# Patient Record
Sex: Male | Born: 1985 | Race: White | Hispanic: No | Smoking: Current every day smoker
Health system: Southern US, Community
[De-identification: ages and names within clinical notes are randomized; demographics above are authoritative.]

## PROBLEM LIST (undated history)

## (undated) VITALS — BP 117/69 | HR 81 | Temp 98.6°F

## (undated) VITALS — BP 123/82 | HR 97 | Temp 98.1°F | Resp 16

## (undated) DIAGNOSIS — F32A Depression, unspecified: Secondary | ICD-10-CM

## (undated) DIAGNOSIS — F4310 Post-traumatic stress disorder, unspecified: Secondary | ICD-10-CM

## (undated) DIAGNOSIS — F1510 Other stimulant abuse, uncomplicated: Secondary | ICD-10-CM

## (undated) DIAGNOSIS — B1920 Unspecified viral hepatitis C without hepatic coma: Secondary | ICD-10-CM

## (undated) DIAGNOSIS — F1290 Cannabis use, unspecified, uncomplicated: Secondary | ICD-10-CM

## (undated) DIAGNOSIS — F1990 Other psychoactive substance use, unspecified, uncomplicated: Secondary | ICD-10-CM

## (undated) DIAGNOSIS — F1110 Opioid abuse, uncomplicated: Secondary | ICD-10-CM

## (undated) DIAGNOSIS — F419 Anxiety disorder, unspecified: Secondary | ICD-10-CM

---

## 2009-01-13 ENCOUNTER — Emergency Department (HOSPITAL_BASED_OUTPATIENT_CLINIC_OR_DEPARTMENT_OTHER): Admission: EM | Admit: 2009-01-13 | Discharge: 2009-01-13 | Payer: Self-pay | Admitting: Emergency Medicine

## 2009-02-04 ENCOUNTER — Emergency Department (HOSPITAL_BASED_OUTPATIENT_CLINIC_OR_DEPARTMENT_OTHER): Admission: EM | Admit: 2009-02-04 | Discharge: 2009-02-04 | Payer: Self-pay | Admitting: Emergency Medicine

## 2009-02-04 ENCOUNTER — Ambulatory Visit: Payer: Self-pay | Admitting: Diagnostic Radiology

## 2009-09-23 ENCOUNTER — Emergency Department (HOSPITAL_COMMUNITY): Admission: EM | Admit: 2009-09-23 | Discharge: 2009-09-24 | Payer: Self-pay | Admitting: Emergency Medicine

## 2009-10-20 ENCOUNTER — Emergency Department (HOSPITAL_COMMUNITY): Admission: EM | Admit: 2009-10-20 | Discharge: 2009-10-21 | Payer: Self-pay | Admitting: Internal Medicine

## 2010-04-16 LAB — DIFFERENTIAL
Basophils Absolute: 0 10*3/uL (ref 0.0–0.1)
Basophils Relative: 0 % (ref 0–1)
Eosinophils Relative: 1 % (ref 0–5)
Monocytes Absolute: 0.6 10*3/uL (ref 0.1–1.0)
Monocytes Relative: 6 % (ref 3–12)
Neutro Abs: 6.2 10*3/uL (ref 1.7–7.7)

## 2010-04-16 LAB — BASIC METABOLIC PANEL
BUN: 5 mg/dL — ABNORMAL LOW (ref 6–23)
Chloride: 108 mEq/L (ref 96–112)
Creatinine, Ser: 0.94 mg/dL (ref 0.4–1.5)
GFR calc Af Amer: >60 mL/min (ref >60)
GFR calc non Af Amer: >60 mL/min (ref >60)

## 2010-04-16 LAB — CBC
Hemoglobin: 17 g/dL (ref 13.0–17.0)
MCH: 32.5 pg (ref 26.0–34.0)
MCHC: 34.7 g/dL (ref 30.0–36.0)
Platelets: 240 10*3/uL (ref 150–400)
RDW: 12.7 % (ref 11.5–15.5)

## 2010-04-16 LAB — RAPID URINE DRUG SCREEN, HOSP PERFORMED
Amphetamines: NOT DETECTED
Benzodiazepines: NOT DETECTED
Cocaine: NOT DETECTED
Opiates: NOT DETECTED

## 2010-04-17 LAB — CBC
HCT: 46.8 % (ref 39.0–52.0)
MCH: 32.7 pg (ref 26.0–34.0)
MCHC: 34.1 g/dL (ref 30.0–36.0)
MCV: 95.7 fL (ref 78.0–100.0)
Platelets: 224 10*3/uL (ref 150–400)
RDW: 13.1 % (ref 11.5–15.5)
WBC: 11.5 10*3/uL — ABNORMAL HIGH (ref 4.0–10.5)

## 2010-04-17 LAB — DIFFERENTIAL
Basophils Relative: 0 % (ref 0–1)
Eosinophils Absolute: 0.4 10*3/uL (ref 0.0–0.7)
Monocytes Absolute: 0.8 10*3/uL (ref 0.1–1.0)
Monocytes Relative: 7 % (ref 3–12)
Neutro Abs: 6.3 10*3/uL (ref 1.7–7.7)
Neutrophils Relative %: 55 % (ref 43–77)

## 2010-04-17 LAB — BASIC METABOLIC PANEL
BUN: 10 mg/dL (ref 6–23)
CO2: 28 mEq/L (ref 19–32)
Calcium: 9.3 mg/dL (ref 8.4–10.5)
Chloride: 106 mEq/L (ref 96–112)
GFR calc non Af Amer: >60 mL/min (ref >60)
Glucose, Bld: 101 mg/dL — ABNORMAL HIGH (ref 70–99)

## 2010-04-17 LAB — RAPID URINE DRUG SCREEN, HOSP PERFORMED
Amphetamines: NOT DETECTED
Opiates: POSITIVE — AB
Tetrahydrocannabinol: NOT DETECTED
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
## 2011-07-28 DIAGNOSIS — F1490 Cocaine use, unspecified, uncomplicated: Secondary | ICD-10-CM | POA: Insufficient documentation

## 2011-07-28 DIAGNOSIS — F17200 Nicotine dependence, unspecified, uncomplicated: Secondary | ICD-10-CM | POA: Diagnosis present

## 2011-07-28 DIAGNOSIS — F1011 Alcohol abuse, in remission: Secondary | ICD-10-CM | POA: Insufficient documentation

## 2019-01-30 ENCOUNTER — Other Ambulatory Visit: Payer: Self-pay

## 2019-01-30 ENCOUNTER — Emergency Department (HOSPITAL_COMMUNITY)
Admission: EM | Admit: 2019-01-30 | Discharge: 2019-01-30 | Disposition: A | Discharge status: Home / Self Care | Payer: Medicaid Other | Attending: Emergency Medicine | Admitting: Emergency Medicine

## 2019-01-30 DIAGNOSIS — K0889 Other specified disorders of teeth and supporting structures: Secondary | ICD-10-CM | POA: Insufficient documentation

## 2019-01-30 MED ORDER — KETOROLAC TROMETHAMINE 30 MG/ML IJ SOLN
30.0000 mg | Freq: Once | INTRAMUSCULAR | Status: AC
  Administered 2019-01-30: 30 mg via INTRAMUSCULAR
  Filled 2019-01-30: qty 1

## 2019-01-30 MED ORDER — CLINDAMYCIN HCL 150 MG PO CAPS: 300.0000 mg | ORAL_CAPSULE | Freq: Three times a day (TID) | ORAL | 0 refills | Status: AC

## 2019-01-30 MED ORDER — IBUPROFEN 800 MG PO TABS: 800.0000 mg | ORAL_TABLET | Freq: Three times a day (TID) | ORAL | 0 refills | Status: AC

## 2019-01-30 MED ORDER — HYDROCODONE-ACETAMINOPHEN 5-325 MG PO TABS
1.0000 | ORAL_TABLET | Freq: Once | ORAL | Status: DC
  Filled 2019-01-30: qty 1

## 2019-01-30 NOTE — ED Provider Notes (Signed)
[facility] DEPT Provider Note   CSN: [id number] Arrival date & time: 01/30/19  1046     History Chief Complaint  Patient presents with  . Dental Pain    Nathan Escobar is a 33 y.o. male.  33 y.o male with no PMH presents to the ED with a chief complaint of dental pain x 1 year. Patient describes as an aching sensation to the right upper gumline, this is worse with talking along with mastication. He reports this has been ongoing for the past year however he has failed to seek treatment due to lack of insurance. He has tried Orajel, ibuprofen without improvement in symptoms. He denies any difficulty swallowing, facial swelling or fevers.   The history is provided by the patient.  Dental Pain Location:  Upper Associated symptoms: no fever        No past medical history on file.  There are no problems to display for this patient.      No family history on file.  Social History   Tobacco Use  . Smoking status: Not on file  Substance Use Topics  . Alcohol use: Not on file  . Drug use: Not on file    Home Medications Prior to Admission medications   Medication Sig Start Date End Date Taking? Authorizing Provider  clindamycin (CLEOCIN) 150 MG capsule Take 2 capsules (300 mg total) by mouth 3 (three) times daily for 7 days. 01/30/19 02/06/19  Janeece Fitting, PA-C  ibuprofen (ADVIL) 800 MG tablet Take 1 tablet (800 mg total) by mouth 3 (three) times daily for 7 days. 01/30/19 02/06/19  Janeece Fitting, PA-C    Allergies    Patient has no known allergies.  Review of Systems   Review of Systems  Constitutional: Negative for fever.  HENT: Positive for dental problem.     Physical Exam Updated Vital Signs BP 136/78   Pulse 79   Temp 98.1 F (36.7 C) (Oral)   Resp 16   SpO2 99%   Physical Exam Vitals and nursing note reviewed.  Constitutional:      Appearance: Normal appearance.  HENT:     Head: Normocephalic and atraumatic.     Nose:  Nose normal.     Mouth/Throat:     Mouth: Mucous membranes are moist.     Dentition: Abnormal dentition. Has dentures. Dental tenderness present. No gingival swelling, dental abscesses or gum lesions.     Pharynx: Oropharynx is clear.     Tonsils: No tonsillar exudate or tonsillar abscesses.     Comments: Poor dentition throughout. Half of third molar is missing, chipped. White pus noted around.  Eyes:     Pupils: Pupils are equal, round, and reactive to light.  Cardiovascular:     Rate and Rhythm: Normal rate.     Pulses: Normal pulses.  Pulmonary:     Effort: Pulmonary effort is normal.  Abdominal:     General: Abdomen is flat.  Musculoskeletal:     Cervical back: Normal range of motion and neck supple.  Skin:    General: Skin is warm and dry.  Neurological:     Mental Status: He is alert and oriented to person, place, and time.     ED Results / Procedures / Treatments   Labs (all labs ordered are listed, but only abnormal results are displayed) Labs Reviewed - No data to display  EKG None  Radiology No results found.  Procedures Procedures (including critical care time)  Medications Ordered  in ED Medications  ketorolac (TORADOL) 30 MG/ML injection 30 mg (30 mg Intramuscular Given 01/30/19 1147)    ED Course  I have reviewed the triage vital signs and the nursing notes.  Pertinent labs & imaging results that were available during my care of the patient were reviewed by me and considered in my medical decision making (see chart for details).    MDM Rules/Calculators/A&P  Patient with a pertinent past medical history presents to the ED with complaints of dental pain, this is been ongoing for the past year, he has failed to schedule an appointment with dentist due to financial reasons.  During my evaluation missing teeth throughout his whole mouth, there is poor dentition throughout, small amount of pus on the back of his gumline, he is currently smoking  approximately half a pack a day.  Tended to provide patient with pain medication while in the ED, unfortunate patient is currently on Suboxone and cannot take any narcotic pain medication.  Toradol IM 30 mg was given.  Patient will go home with a prescription for clindamycin, ibuprofen, dental resources in order to follow-up with dental care.  He understands and agrees to management, return precautions provided at length.   Portions of this note were generated with Scientist, clinical (histocompatibility and immunogenetics). Dictation errors may occur despite best attempts at proofreading.  Final Clinical Impression(s) / ED Diagnoses Final diagnoses:  Pain, dental    Rx / DC Orders ED Discharge Orders         Ordered    clindamycin (CLEOCIN) 150 MG capsule  3 times daily     01/30/19 1147    ibuprofen (ADVIL) 800 MG tablet  3 times daily     01/30/19 1147           [name] Manges, PA-C 01/30/19 1152    Terald Sleeper, MD 01/30/19 2115
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
## 2019-01-30 NOTE — Discharge Instructions (Signed)
I have prescribed antibiotics to help treat your dental infection, please take 2 tablets 3 times a day for the next 7 days.  I also prescribed pain medication, you may take this for a maximum dose of 3 daily.  The number to dental resources attached to your chart,please schedule an appointment for further follow up.

## 2019-02-21 DIAGNOSIS — Z765 Malingerer [conscious simulation]: Secondary | ICD-10-CM | POA: Insufficient documentation

## 2019-02-21 DIAGNOSIS — F1520 Other stimulant dependence, uncomplicated: Secondary | ICD-10-CM | POA: Insufficient documentation

## 2019-03-08 DIAGNOSIS — F602 Antisocial personality disorder: Secondary | ICD-10-CM | POA: Diagnosis present

## 2019-03-08 DIAGNOSIS — F1120 Opioid dependence, uncomplicated: Secondary | ICD-10-CM | POA: Insufficient documentation

## 2019-04-03 ENCOUNTER — Encounter: Payer: Medicaid Other | Admitting: Family

## 2019-04-23 ENCOUNTER — Encounter: Payer: Medicaid Other | Admitting: Family

## 2019-05-21 ENCOUNTER — Encounter: Payer: Medicaid Other | Admitting: Family

## 2020-04-18 DIAGNOSIS — T43625A Adverse effect of amphetamines, initial encounter: Secondary | ICD-10-CM | POA: Insufficient documentation

## 2021-05-20 DIAGNOSIS — F4310 Post-traumatic stress disorder, unspecified: Secondary | ICD-10-CM | POA: Insufficient documentation

## 2021-06-23 ENCOUNTER — Other Ambulatory Visit (HOSPITAL_COMMUNITY)
Admission: EM | Admit: 2021-06-23 | Discharge: 2021-06-25 | Disposition: A | Discharge status: Home / Self Care | Payer: Medicaid Other | Attending: Psychiatry | Admitting: Psychiatry

## 2021-06-23 ENCOUNTER — Ambulatory Visit (HOSPITAL_COMMUNITY)
Admission: AD | Admit: 2021-06-23 | Discharge: 2021-06-23 | Disposition: A | Discharge status: Home / Self Care | Payer: Medicaid Other | Source: Home / Self Care | Attending: Psychiatry | Admitting: Psychiatry

## 2021-06-23 ENCOUNTER — Encounter (HOSPITAL_COMMUNITY): Payer: Self-pay | Admitting: Nurse Practitioner

## 2021-06-23 DIAGNOSIS — F1290 Cannabis use, unspecified, uncomplicated: Secondary | ICD-10-CM | POA: Insufficient documentation

## 2021-06-23 DIAGNOSIS — F1110 Opioid abuse, uncomplicated: Secondary | ICD-10-CM | POA: Diagnosis not present

## 2021-06-23 DIAGNOSIS — R443 Hallucinations, unspecified: Secondary | ICD-10-CM | POA: Insufficient documentation

## 2021-06-23 DIAGNOSIS — F1990 Other psychoactive substance use, unspecified, uncomplicated: Secondary | ICD-10-CM

## 2021-06-23 DIAGNOSIS — Z20822 Contact with and (suspected) exposure to covid-19: Secondary | ICD-10-CM | POA: Insufficient documentation

## 2021-06-23 DIAGNOSIS — F1520 Other stimulant dependence, uncomplicated: Secondary | ICD-10-CM | POA: Diagnosis not present

## 2021-06-23 DIAGNOSIS — F32A Depression, unspecified: Secondary | ICD-10-CM | POA: Insufficient documentation

## 2021-06-23 DIAGNOSIS — F1910 Other psychoactive substance abuse, uncomplicated: Secondary | ICD-10-CM | POA: Insufficient documentation

## 2021-06-23 DIAGNOSIS — F1994 Other psychoactive substance use, unspecified with psychoactive substance-induced mood disorder: Secondary | ICD-10-CM | POA: Diagnosis not present

## 2021-06-23 DIAGNOSIS — R45851 Suicidal ideations: Secondary | ICD-10-CM | POA: Insufficient documentation

## 2021-06-23 HISTORY — DX: Post-traumatic stress disorder, unspecified: F43.10

## 2021-06-23 HISTORY — DX: Opioid abuse, uncomplicated: F11.10

## 2021-06-23 HISTORY — DX: Depression, unspecified: F32.A

## 2021-06-23 HISTORY — DX: Other psychoactive substance use, unspecified, uncomplicated: F19.90

## 2021-06-23 HISTORY — DX: Anxiety disorder, unspecified: F41.9

## 2021-06-23 HISTORY — DX: Unspecified viral hepatitis C without hepatic coma: B19.20

## 2021-06-23 HISTORY — DX: Cannabis use, unspecified, uncomplicated: F12.90

## 2021-06-23 HISTORY — DX: Other stimulant abuse, uncomplicated: F15.10

## 2021-06-23 LAB — RESP PANEL BY RT-PCR (FLU A&B, COVID) ARPGX2
Influenza A by PCR: NEGATIVE
Influenza B by PCR: NEGATIVE
SARS Coronavirus 2 by RT PCR: NEGATIVE

## 2021-06-23 MED ORDER — ONDANSETRON 4 MG PO TBDP: 4.0000 mg | ORAL_TABLET | Freq: Four times a day (QID) | ORAL | Status: DC | PRN

## 2021-06-23 MED ORDER — HYDROXYZINE HCL 25 MG PO TABS: 25.0000 mg | ORAL_TABLET | Freq: Four times a day (QID) | ORAL | Status: DC | PRN

## 2021-06-23 MED ORDER — DICYCLOMINE HCL 20 MG PO TABS: 20.0000 mg | ORAL_TABLET | Freq: Four times a day (QID) | ORAL | Status: DC | PRN

## 2021-06-23 MED ORDER — ALUM & MAG HYDROXIDE-SIMETH 200-200-20 MG/5ML PO SUSP: 30.0000 mL | ORAL | Status: DC | PRN

## 2021-06-23 MED ORDER — LOPERAMIDE HCL 2 MG PO CAPS: 2.0000 mg | ORAL_CAPSULE | ORAL | Status: DC | PRN

## 2021-06-23 MED ORDER — MAGNESIUM HYDROXIDE 400 MG/5ML PO SUSP: 30.0000 mL | Freq: Every day | ORAL | Status: DC | PRN

## 2021-06-23 MED ORDER — NAPROXEN 500 MG PO TABS: 500.0000 mg | ORAL_TABLET | Freq: Two times a day (BID) | ORAL | Status: DC | PRN

## 2021-06-23 MED ORDER — GABAPENTIN 400 MG PO CAPS
400.0000 mg | ORAL_CAPSULE | Freq: Three times a day (TID) | ORAL | Status: DC
  Administered 2021-06-23 – 2021-06-25 (×5): 400 mg via ORAL
  Filled 2021-06-23 (×5): qty 1

## 2021-06-23 MED ORDER — ACETAMINOPHEN 325 MG PO TABS
650.0000 mg | ORAL_TABLET | Freq: Four times a day (QID) | ORAL | Status: DC | PRN
Start: 2021-06-23 — End: 2021-06-25

## 2021-06-23 MED ORDER — METHOCARBAMOL 500 MG PO TABS: 500.0000 mg | ORAL_TABLET | Freq: Three times a day (TID) | ORAL | Status: DC | PRN

## 2021-06-23 MED ORDER — QUETIAPINE FUMARATE 50 MG PO TABS
50.0000 mg | ORAL_TABLET | Freq: Every day | ORAL | Status: DC
  Administered 2021-06-23 – 2021-06-24 (×2): 50 mg via ORAL
  Filled 2021-06-23 (×2): qty 1
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
## 2021-06-23 NOTE — ED Notes (Signed)
Sharilyn Sites has been called to pick up pts specimen and take them over to  lab

## 2021-06-23 NOTE — ED Notes (Signed)
Pt is currently sleeping, no distress noted, environmental check complete, will continue to monitor patient for safety. ? ?
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
## 2021-06-23 NOTE — Progress Notes (Signed)
Patient's 15 min antigen was negative. PCR sent to the lab.

## 2021-06-23 NOTE — ED Notes (Signed)
While taking patients vitals pt c/o of upper abdominal lower left sided chest pain. Pt states that he think it is due to the fact that he has been doing Meth over the weekend. Pt rates the pain at a 2 out of 10. RN made NP Channing Mutters aware of patients complaints.

## 2021-06-23 NOTE — ED Notes (Signed)
Pt has been brought on the unit, familiarized with unit. Pt has eaten a sandwich and drank cranberry juice. Medication has been given. PT is now lying in the bed quietly, with eyes closed, will continue to monitor patient for safety

## 2021-06-24 ENCOUNTER — Encounter (HOSPITAL_COMMUNITY): Payer: Self-pay | Admitting: Nurse Practitioner

## 2021-06-24 ENCOUNTER — Other Ambulatory Visit: Payer: Self-pay

## 2021-06-24 ENCOUNTER — Encounter (HOSPITAL_COMMUNITY): Payer: Self-pay

## 2021-06-24 DIAGNOSIS — F1520 Other stimulant dependence, uncomplicated: Secondary | ICD-10-CM | POA: Diagnosis not present

## 2021-06-24 DIAGNOSIS — Z20822 Contact with and (suspected) exposure to covid-19: Secondary | ICD-10-CM | POA: Diagnosis not present

## 2021-06-24 DIAGNOSIS — F1994 Other psychoactive substance use, unspecified with psychoactive substance-induced mood disorder: Secondary | ICD-10-CM | POA: Diagnosis not present

## 2021-06-24 DIAGNOSIS — F1110 Opioid abuse, uncomplicated: Secondary | ICD-10-CM | POA: Diagnosis not present

## 2021-06-24 LAB — POCT URINE DRUG SCREEN - MANUAL ENTRY (I-SCREEN)
POC Amphetamine UR: NOT DETECTED
POC Buprenorphine (BUP): NOT DETECTED
POC Cocaine UR: NOT DETECTED
POC Marijuana UR: POSITIVE — AB
POC Methadone UR: NOT DETECTED
POC Methamphetamine UR: POSITIVE — AB
POC Morphine: NOT DETECTED
POC Oxazepam (BZO): NOT DETECTED
POC Oxycodone UR: NOT DETECTED
POC Secobarbital (BAR): NOT DETECTED

## 2021-06-24 LAB — COMPREHENSIVE METABOLIC PANEL
ALT: 52 U/L — ABNORMAL HIGH (ref 0–44)
AST: 28 U/L (ref 15–41)
Albumin: 3.3 g/dL — ABNORMAL LOW (ref 3.5–5.0)
Alkaline Phosphatase: 57 U/L (ref 38–126)
Anion gap: 8 (ref 5–15)
BUN: 14 mg/dL (ref 6–20)
CO2: 27 mmol/L (ref 22–32)
Calcium: 9 mg/dL (ref 8.9–10.3)
Chloride: 104 mmol/L (ref 98–111)
Creatinine, Ser: 0.85 mg/dL (ref 0.61–1.24)
GFR, Estimated: >60 mL/min (ref >60)
Glucose, Bld: 119 mg/dL — ABNORMAL HIGH (ref 70–99)
Potassium: 3.6 mmol/L (ref 3.5–5.1)
Sodium: 139 mmol/L (ref 135–145)
Total Bilirubin: 0.2 mg/dL — ABNORMAL LOW (ref 0.3–1.2)
Total Protein: 6.1 g/dL — ABNORMAL LOW (ref 6.5–8.1)

## 2021-06-24 LAB — CBC WITH DIFFERENTIAL/PLATELET
Abs Immature Granulocytes: 0.1 10*3/uL — ABNORMAL HIGH (ref 0.00–0.07)
Basophils Absolute: 0.1 10*3/uL (ref 0.0–0.1)
Basophils Relative: 0 %
Eosinophils Absolute: 0.3 10*3/uL (ref 0.0–0.5)
Eosinophils Relative: 2 %
HCT: 42.1 % (ref 39.0–52.0)
Hemoglobin: 14.1 g/dL (ref 13.0–17.0)
Immature Granulocytes: 1 %
Lymphocytes Relative: 27 %
Lymphs Abs: 3.3 10*3/uL (ref 0.7–4.0)
MCH: 31.3 pg (ref 26.0–34.0)
MCHC: 33.5 g/dL (ref 30.0–36.0)
MCV: 93.6 fL (ref 80.0–100.0)
Monocytes Absolute: 0.8 10*3/uL (ref 0.1–1.0)
Monocytes Relative: 6 %
Neutro Abs: 7.6 10*3/uL (ref 1.7–7.7)
Neutrophils Relative %: 64 %
Platelets: 267 10*3/uL (ref 150–400)
RBC: 4.5 MIL/uL (ref 4.22–5.81)
RDW: 13.6 % (ref 11.5–15.5)
WBC: 12.1 10*3/uL — ABNORMAL HIGH (ref 4.0–10.5)
nRBC: 0 % (ref 0.0–0.2)

## 2021-06-24 LAB — RPR: RPR Ser Ql: NONREACTIVE

## 2021-06-24 LAB — LIPID PANEL
Cholesterol: 102 mg/dL (ref 0–200)
HDL: 53 mg/dL (ref >40)
LDL Cholesterol: 17 mg/dL (ref 0–99)
Total CHOL/HDL Ratio: 1.9 RATIO
Triglycerides: 159 mg/dL — ABNORMAL HIGH (ref <150)
VLDL: 32 mg/dL (ref 0–40)

## 2021-06-24 LAB — HEPATITIS PANEL, ACUTE
HCV Ab: REACTIVE — AB
Hep A IgM: NONREACTIVE
Hep B C IgM: NONREACTIVE
Hepatitis B Surface Ag: NONREACTIVE

## 2021-06-24 LAB — GC/CHLAMYDIA PROBE AMP (~~LOC~~) NOT AT ARMC
Chlamydia: NEGATIVE
Comment: NEGATIVE
Comment: NORMAL
Neisseria Gonorrhea: NEGATIVE

## 2021-06-24 LAB — HEMOGLOBIN A1C
Hgb A1c MFr Bld: 5.4 % (ref 4.8–5.6)
Mean Plasma Glucose: 108.28 mg/dL

## 2021-06-24 LAB — TSH: TSH: 1.013 u[IU]/mL (ref 0.350–4.500)

## 2021-06-24 LAB — ETHANOL: Alcohol, Ethyl (B): <10 mg/dL (ref <10)

## 2021-06-24 LAB — HIV ANTIBODY (ROUTINE TESTING W REFLEX): HIV Screen 4th Generation wRfx: NONREACTIVE

## 2021-06-24 MED ORDER — FLUOXETINE HCL 20 MG PO CAPS
20.0000 mg | ORAL_CAPSULE | Freq: Every day | ORAL | Status: DC
  Administered 2021-06-24 – 2021-06-25 (×2): 20 mg via ORAL
  Filled 2021-06-24 (×2): qty 1

## 2021-06-24 NOTE — ED Notes (Addendum)
Pt woke up, breakfast given

## 2021-06-24 NOTE — ED Notes (Signed)
Pt has taken night time medications and is in his room sleeping.

## 2021-06-24 NOTE — ED Notes (Signed)
Notified pt that breakfast is ready 

## 2021-06-24 NOTE — BH IP Treatment Plan (Signed)
Interdisciplinary Treatment and Diagnostic Plan Update  06/24/2021 Time of Session: 10:20AM Nathan Escobar MRN: [id number]  Diagnosis:  Final diagnoses:  Methamphetamine use disorder, severe, dependence (HCC)  Heroin abuse (HCC)  Substance induced mood disorder (HCC)  IVDU (intravenous drug user)  Marijuana use     Current Medications:  Current Facility-Administered Medications  Medication Dose Route Frequency Provider Last Rate Last Admin   acetaminophen (TYLENOL) tablet 650 mg  650 mg Oral Q6H PRN Jackelyn PolingBerry, Jason A, NP       alum & mag hydroxide-simeth (MAALOX/MYLANTA) 200-200-20 MG/5ML suspension 30 mL  30 mL Oral Q4H PRN Jackelyn PolingBerry, Jason A, NP       dicyclomine (BENTYL) tablet 20 mg  20 mg Oral Q6H PRN Jackelyn PolingBerry, Jason A, NP       FLUoxetine (PROZAC) capsule 20 mg  20 mg Oral Daily Mariel CraftMaurer, Sheila M, MD   20 mg at 06/24/21 1131   gabapentin (NEURONTIN) capsule 400 mg  400 mg Oral TID Jackelyn PolingBerry, Jason A, NP   400 mg at 06/24/21 1556   hydrOXYzine (ATARAX) tablet 25 mg  25 mg Oral Q6H PRN Jackelyn PolingBerry, Jason A, NP       loperamide (IMODIUM) capsule 2-4 mg  2-4 mg Oral PRN Jackelyn PolingBerry, Jason A, NP       magnesium hydroxide (MILK OF MAGNESIA) suspension 30 mL  30 mL Oral Daily PRN Jackelyn PolingBerry, Jason A, NP       methocarbamol (ROBAXIN) tablet 500 mg  500 mg Oral Q8H PRN Nira ConnBerry, Jason A, NP       naproxen (NAPROSYN) tablet 500 mg  500 mg Oral BID PRN Nira ConnBerry, Jason A, NP       ondansetron (ZOFRAN-ODT) disintegrating tablet 4 mg  4 mg Oral Q6H PRN Nira ConnBerry, Jason A, NP       QUEtiapine (SEROQUEL) tablet 50 mg  50 mg Oral QHS Nira ConnBerry, Jason A, NP   50 mg at 06/23/21 2312   Current Outpatient Medications  Medication Sig Dispense Refill   FLUoxetine (PROZAC) 40 MG capsule Take 40 mg by mouth daily.     gabapentin (NEURONTIN) 400 MG capsule Take 400 mg by mouth 3 (three) times daily.     QUEtiapine (SEROQUEL) 50 MG tablet Take 50 mg by mouth at bedtime as needed (For sleep).     SUBOXONE 8-2 MG FILM Place 1 Film under the tongue 3  (three) times daily.     PTA Medications: Prior to Admission medications   Medication Sig Start Date End Date Taking? Authorizing Provider  FLUoxetine (PROZAC) 40 MG capsule Take 40 mg by mouth daily. 04/29/21  Yes [provider]  gabapentin (NEURONTIN) 400 MG capsule Take 400 mg by mouth 3 (three) times daily. 05/06/21  Yes [provider]  QUEtiapine (SEROQUEL) 50 MG tablet Take 50 mg by mouth at bedtime as needed (For sleep). 05/21/21  Yes [provider]  SUBOXONE 8-2 MG FILM Place 1 Film under the tongue 3 (three) times daily. 05/19/21  Yes [provider]    Patient Stressors: Financial difficulties   Substance abuse    Patient Strengths: Motivation for treatment/growth   Treatment Modalities: Medication Management, Group therapy, Case management,  1 to 1 session with clinician, Psychoeducation, Recreational therapy.   Physician Treatment Plan for Primary and Secondary Diagnosis:  Final diagnoses:  Methamphetamine use disorder, severe, dependence (HCC)  Heroin abuse (HCC)  Substance induced mood disorder (HCC)  IVDU (intravenous drug user)  Marijuana use   Long Term Goal(s): Improvement in  symptoms so as ready for discharge  Short Term Goals: Ability to identify changes in lifestyle to reduce recurrence of condition will improve Ability to verbalize feelings will improve Ability to disclose and discuss suicidal ideas Ability to demonstrate self-control will improve Ability to identify and develop effective coping behaviors will improve Compliance with prescribed medications will improve Ability to identify triggers associated with substance abuse/mental health issues will improve  Medication Management: Evaluate patient's response, side effects, and tolerance of medication regimen.  Therapeutic Interventions: 1 to 1 sessions, Unit Group sessions and Medication administration.  Evaluation of Outcomes: Progressing  RN Treatment Plan for  Primary Diagnosis:  Final diagnoses:  Methamphetamine use disorder, severe, dependence (HCC)  Heroin abuse (HCC)  Substance induced mood disorder (HCC)  IVDU (intravenous drug user)  Marijuana use    Long Term Goal(s): Knowledge of disease and therapeutic regimen to maintain health will improve  Short Term Goals: Ability to identify and develop effective coping behaviors will improve  Medication Management: RN will administer medications as ordered by provider, will assess and evaluate patient's response and provide education to patient for prescribed medication. RN will report any adverse and/or side effects to prescribing provider.  Therapeutic Interventions: 1 on 1 counseling sessions, Psychoeducation, Medication administration, Evaluate responses to treatment, Monitor vital signs and CBGs as ordered, Perform/monitor CIWA, COWS, AIMS and Fall Risk screenings as ordered, Perform wound care treatments as ordered.  Evaluation of Outcomes: Progressing   LCSW Treatment Plan for Primary Diagnosis:  Final diagnoses:  Methamphetamine use disorder, severe, dependence (HCC)  Heroin abuse (HCC)  Substance induced mood disorder (HCC)  IVDU (intravenous drug user)  Marijuana use    Long Term Goal(s): Safe transition to appropriate next level of care at discharge, Engage patient in therapeutic group addressing interpersonal concerns.  Short Term Goals: Engage patient in aftercare planning with referrals and resources  Therapeutic Interventions: Assess for all discharge needs, 1 to 1 time with Social worker, Explore available resources and support systems, Assess for adequacy in community support network, Educate family and significant other(s) on suicide prevention, Complete Psychosocial Assessment, Interpersonal group therapy.  Evaluation of Outcomes: Progressing   Progress in Treatment: Attending groups: No. Participating in groups: No. Taking medication as prescribed:  Yes. Toleration medication: Yes. Family/Significant other contact made: No, will contact:  no one Patient understands diagnosis: Yes. Discussing patient identified problems/goals with staff: Yes. Medical problems stabilized or resolved: Yes. Providers are following up on recommendations for patient's Hep C diagnosis.  Denies suicidal/homicidal ideation: Yes. Issues/concerns per patient self-inventory: No. Other: None   New problem(s) identified: None   New Short Term/Long Term Goal(s): Nathan Escobar shared that he wants to gain sobriety by participating in a residential treatment program.  Patient Goals:  "I need to go to treatment. I want to get clean"  Discharge Plan or Barriers: Nathan Escobar is tentatively scheduled for a screening appointment to [facility] for a screening appointment on Friday, 06/26/21 at 9:00am at [facility] for admission.   Reason for Continuation of Hospitalization: Medication stabilization  Estimated Length of Stay: 2-3 days  Last 3 [place] Suicide Severity Risk Score: Flowsheet Row ED from [facility]/23/2021 in [facility] Most recent reading at 06/24/2021 12:44 AM [facility] Most recent reading at 06/23/2021  9:08 PM  C-SSRS RISK CATEGORY High Risk High Risk       Last PHQ 2/9 Scores:     View : No  data to display.          Scribe for Treatment Team: Maeola Sarah, [place] 06/24/2021 4:24 PM

## 2021-06-24 NOTE — ED Notes (Signed)
Pt is currently sleeping, no distress noted, environmental check complete, will continue to monitor patient for safety. ? ?

## 2021-06-24 NOTE — ED Notes (Signed)
Pt resting quietly with eyes closed.  No pain or discomfort noted/voiced. Denies SI, HI, and AVH. Breathing is even and unlabored.  Will continue to monitor for safety.   

## 2021-06-24 NOTE — ED Notes (Signed)
Pt was notified that lunch is ready
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
## 2021-06-24 NOTE — ED Notes (Signed)
Pts declined AA he is in room sleeping.

## 2021-06-24 NOTE — ED Notes (Signed)
Pt resting quietly with eyes closed.  No pain or discomfort noted/voiced.  Breathing is even and unlabored.  Will continue to monitor for safety.  

## 2021-06-24 NOTE — ED Notes (Signed)
Pt refused to allow morning vitals to be taken

## 2021-06-24 NOTE — Clinical Social Work Psych Note (Signed)
LCSW Initial Note   LCSW met with Nathan Escobar for introduction and to begin discussions regarding treatment and potential discharge planning.   Nathan Escobar was initially sleeping, until he was awaken by this Probation officer and Dr. Leverne Humbles. Nathan Escobar presented with a dysphoric affect, congruent mood, however was pleasant and cooperative.   Nathan Escobar denied having any SI, HI or AVH at this time.   Nathan Escobar presented to the [facility] seeking assistance for worsening depression, suicidal thoughts without a plan, and request for substance abuse treatment. Nathan Escobar reports that the 1 year anniversary of his wife's death is approaching and he identifies this as a trigger for his depression and suicidal ideations. Nathan Escobar shared that he had a suicide attempt two weeks ago when he jumped from a vehicle into traffic.    Nathan Escobar endorsed injecting both heroin and methamphetamines as his drugs of choice. He reports using 1 gram of both substances on a daily basis. Nathan Escobar shared that he also experiences auditory hallucinations of voices call his name and visual hallucinations of spiders. He reports that his AVH only occurs during methamphetamine use.   Nathan Escobar shared that he currently lives with his grandmother and other family members, however he described the arrangement as "not good". Nathan Escobar reports there are five people living in his grandmother's two bedroom home.   Nathan Escobar expressed interest in participating in a residential treatment program following his stay at the [facility].   Dr. Leverne Humbles and Nathan Escobar agreed to restart his previous psychiatric medications for stabilization.   LCSW explained the referral process for residential treatment services and Arian expressed understanding.   LCSW referred Nathan Escobar to [facility] for review.   Bran denied having any additional questions or concerns at time. LCSW will continue to follow for any additional questions, concerns or additional needs.    Radonna Ricker, MSW, LCSW Clinical Research officer, political party ([place])  [facility]

## 2021-06-24 NOTE — ED Notes (Signed)
Patient admitted to [facility] endorsing SI, and substance abuse. Patient was cooperative during the admission assessment. Skin assessment complete. Patient oriented to unit and unit rules. Meal and drinks offered to patient. Patient resting quietly. Will continue to monitor for safety.

## 2021-06-25 DIAGNOSIS — F1110 Opioid abuse, uncomplicated: Secondary | ICD-10-CM | POA: Diagnosis not present

## 2021-06-25 DIAGNOSIS — F1994 Other psychoactive substance use, unspecified with psychoactive substance-induced mood disorder: Secondary | ICD-10-CM | POA: Diagnosis not present

## 2021-06-25 DIAGNOSIS — Z20822 Contact with and (suspected) exposure to covid-19: Secondary | ICD-10-CM | POA: Diagnosis not present

## 2021-06-25 DIAGNOSIS — F1520 Other stimulant dependence, uncomplicated: Secondary | ICD-10-CM | POA: Diagnosis not present

## 2021-06-25 MED ORDER — GABAPENTIN 400 MG PO CAPS: 400.0000 mg | ORAL_CAPSULE | Freq: Three times a day (TID) | ORAL | 0 refills | Status: DC

## 2021-06-25 MED ORDER — FLUOXETINE HCL 20 MG PO CAPS: 20.0000 mg | ORAL_CAPSULE | Freq: Every day | ORAL | 0 refills | Status: DC

## 2021-06-25 MED ORDER — QUETIAPINE FUMARATE 50 MG PO TABS: 50.0000 mg | ORAL_TABLET | Freq: Every day | ORAL | 0 refills | Status: DC

## 2021-06-25 NOTE — ED Notes (Addendum)
Pt asked for lunch at 09:40 AM, Tech told him lunch is at 12. Snacks and drinks given

## 2021-06-25 NOTE — Discharge Instructions (Addendum)

## 2021-06-25 NOTE — ED Notes (Signed)
Patient was awake for breakfast this morning.  Calm and quiet without complaint.  He has returned to room and sleep.  No withdrawal at this time.  Will monitor and provide safe supportive environment.

## 2021-06-25 NOTE — Clinical Social Work Psych Note (Signed)
Nathan Escobar Update/Discharge Note  Nathan Escobar shared that he felt "good" this morning. He denied having any SI, HI or AVH at this time. Nathan Escobar also denied having any physical complaints or withdrawal symptoms.   Nathan Escobar informed Nathan Escobar that his Hepatitis C follow-up could be a barrier for discharging to a residential treatment facility. Nathan Escobar shared that Nathan Escobar, the patient would have to be seen by a provider at [facility] for Infectious Disease prior to going to a rehab for treatment services to ensure he has the appropriate medications and follow up while in treatment.   Nathan Escobar is scheduled for his initial appointment with [facility] for Infectious Disease on Tuesday, 06/30/2021 at 1:45pm. This information was also charted in the patient's AVS.  Nathan Escobar expressed understanding and shared with Nathan Escobar that he would like to be discharged either way due to learning about a "family trip" coming up with his girlfriend and their children. Nathan Escobar reports that once he returns from their "Family trip" he plans on following up with Nathan Escobar for admission.    Nathan Escobar authorized a taxi voucher to [employer] with transportation home. Nathan Escobar reports he plans to return to his previous living arrangements at his grandmother's home.   Appropriate resources for outpatient psychiatric and substance abuse were listed in patient's AVS.   Nathan Escobar denied having any additional questions or concerns.   Radonna Ricker, MSW, Nathan Escobar Clinical Education officer, museum ([place]) [facility]

## 2021-06-25 NOTE — ED Notes (Signed)
Snacks given 

## 2021-06-25 NOTE — ED Notes (Signed)
Pt is currently sleeping, no distress noted, environmental check complete, will continue to monitor patient for safety. ? ?

## 2021-06-25 NOTE — ED Provider Notes (Signed)
FBC/OBS ASAP Discharge Summary  Date and Time: 06/25/2021 11:39 AM  Name: Nathan Escobar  MRN:  [id number]   Discharge Diagnoses:  Final diagnoses:  Methamphetamine use disorder, severe, dependence (HCC)  Heroin abuse (HCC)  Substance induced mood disorder (HCC)  IVDU (intravenous drug user)  Marijuana use  Polysubstance abuse (HCC)    Subjective: "I have a trip planned with my baby mama and my kids to go to patient/, [place].  It has been on my bucket list.  My grandfather always talked about it before he passed away."  Stay Summary: Nathan Escobar is a 36 y.o. with a reported history of schizophrenia, bipolar disorder, PTSD, methamphetamine abuse, heroin abuse, marijuana use, IVDU, and hepatitis C who presented to [facility] as a walk-in due to worsening depression, suicidal thoughts without a plan, and request for substance abuse treatment. Patient transferred to [facility] for crisis stabilization and substance abuse treatment on 06/23/2021 Patient is aware of resources for substance use rehabilitation after discharge.  Patient feels optimistic that he will be able to be successful with substance use abstinence. Patient completed acute detoxification.  He denies any withdrawal symptoms or cravings on day of discharge. Patient was restarted on his prior home medications fluoxetine 20 mg daily, gabapentin 400 mg 3 times daily, and Seroquel 50 mg at bedtime.  Patient denied any side effect to medication. Patient has continued to deny suicidal and homicidal ideations.  He is not having any auditory hallucinations.  He is able to contract for safety and aware of behavioral health urgent care resources if needed after discharge.  Hepatitis C antibodies were reactive.  Follow-up labs quantitative PCR and nucleic acid amplification tests are pending.  Patient is aware that he will need to follow-up with infectious disease regarding hepatitis C treatment.  Total Time Spent in Direct Patient Care:  I personally  spent 35 minutes on the unit in direct patient care. The direct patient care time included face-to-face time with the patient, reviewing the patient's chart, communicating with other professionals, and coordinating care. Greater than 50% of this time was spent in counseling or coordinating care with the patient regarding goals of hospitalization, psycho-education, and discharge planning needs.   Past Psychiatric History: schizophrenia, bipolar disorder, PTSD, methamphetamine abuse, heroin abuse, marijuana use, IVDU  Past Medical History:  Past Medical History:  Diagnosis Date   Anxiety    Depression    Hepatitis C    Heroin abuse (HCC)    IVDU (intravenous drug user)    Marijuana use    Methamphetamine abuse (HCC)    PTSD (post-traumatic stress disorder)    History reviewed. No pertinent surgical history. Family History:  Family History  Problem Relation Age of Onset   Depression Mother    Drug abuse Father    Bipolar disorder Father    Suicidality Paternal Aunt    Bipolar disorder Paternal Aunt    Family Psychiatric History: yes, see above  Social History:  Social History   Substance and Sexual Activity  Alcohol Use None     Social History   Substance and Sexual Activity  Drug Use Yes   Types: Heroin, Methamphetamines, Marijuana    Social History   Socioeconomic History   Marital status: Widowed    Spouse name: Not on file   Number of children: Not on file   Years of education: Not on file   Highest education level: Not on file  Occupational History   Not on file  Tobacco Use   Smoking  status: Every Day    Packs/day: 1.00    Years: 5.00    Pack years: 5.00    Types: Cigarettes   Smokeless tobacco: Not on file  Substance and Sexual Activity   Alcohol use: Not on file   Drug use: Yes    Types: Heroin, Methamphetamines, Marijuana   Sexual activity: Not on file  Other Topics Concern   Not on file  Social History Narrative   Not on file   Social  Determinants of Health   Financial Resource Strain: Not on file  Food Insecurity: Not on file  Transportation Needs: Not on file  Physical Activity: Not on file  Stress: Not on file  Social Connections: Not on file   SDOH:  SDOH Screenings   Alcohol Screen: Not on file  Depression (PHQ2-9): Not on file  Financial Resource Strain: Not on file  Food Insecurity: Not on file  Housing: Not on file  Physical Activity: Not on file  Social Connections: Not on file  Stress: Not on file  Tobacco Use: High Risk   Smoking Tobacco Use: Every Day   Smokeless Tobacco Use: Unknown   Passive Exposure: Not on file  Transportation Needs: Not on file    Tobacco Cessation:  A prescription for an FDA-approved tobacco cessation medication was offered at discharge and the patient refused  Current Medications:  Current Facility-Administered Medications  Medication Dose Route Frequency Provider Last Rate Last Admin   acetaminophen (TYLENOL) tablet 650 mg  650 mg Oral Q6H PRN Jackelyn Poling, NP       alum & mag hydroxide-simeth (MAALOX/MYLANTA) 200-200-20 MG/5ML suspension 30 mL  30 mL Oral Q4H PRN Jackelyn Poling, NP       dicyclomine (BENTYL) tablet 20 mg  20 mg Oral Q6H PRN Jackelyn Poling, NP       FLUoxetine (PROZAC) capsule 20 mg  20 mg Oral Daily Mariel Craft, MD   20 mg at 06/25/21 1610   gabapentin (NEURONTIN) capsule 400 mg  400 mg Oral TID Jackelyn Poling, NP   400 mg at 06/25/21 9604   hydrOXYzine (ATARAX) tablet 25 mg  25 mg Oral Q6H PRN Jackelyn Poling, NP       loperamide (IMODIUM) capsule 2-4 mg  2-4 mg Oral PRN Jackelyn Poling, NP       magnesium hydroxide (MILK OF MAGNESIA) suspension 30 mL  30 mL Oral Daily PRN Jackelyn Poling, NP       methocarbamol (ROBAXIN) tablet 500 mg  500 mg Oral Q8H PRN Nira Conn A, NP       naproxen (NAPROSYN) tablet 500 mg  500 mg Oral BID PRN Nira Conn A, NP       ondansetron (ZOFRAN-ODT) disintegrating tablet 4 mg  4 mg Oral Q6H PRN Nira Conn A,  NP       QUEtiapine (SEROQUEL) tablet 50 mg  50 mg Oral QHS Nira Conn A, NP   50 mg at 06/24/21 2127   Current Outpatient Medications  Medication Sig Dispense Refill   [START ON 06/26/2021] FLUoxetine (PROZAC) 20 MG capsule Take 1 capsule (20 mg total) by mouth daily. 30 capsule 0   gabapentin (NEURONTIN) 400 MG capsule Take 1 capsule (400 mg total) by mouth 3 (three) times daily. 90 capsule 0   QUEtiapine (SEROQUEL) 50 MG tablet Take 1 tablet (50 mg total) by mouth at bedtime. 30 tablet 0    PTA Medications: (Not in a hospital admission)  Musculoskeletal  Strength & Muscle Tone: within normal limits Gait & Station: normal Patient leans: N/A  Psychiatric Specialty Exam  Presentation  General Appearance: Appropriate for Environment; Casual  Eye Contact:Good  Speech:Clear and Coherent; Normal Rate  Speech Volume:Normal  Handedness:Right   Mood and Affect  Mood:Euthymic ("happy")  Affect:Appropriate; Congruent   Thought Process  Thought Processes:Coherent  Descriptions of Associations:Intact  Orientation:Full (Time, Place and Person)  Thought Content:Logical  Diagnosis of Schizophrenia or Schizoaffective disorder in past: No    Hallucinations:Hallucinations: None  Ideas of Reference:None  Suicidal Thoughts:Suicidal Thoughts: No  Homicidal Thoughts:Homicidal Thoughts: No   Sensorium  Memory:Immediate Good; Recent Good; Remote Good  Judgment:Fair  Insight:Fair   Executive Functions  Concentration:Good  Attention Span:Good  Recall:Good  Fund of Knowledge:Good  Language:Good   Psychomotor Activity  Psychomotor Activity:Psychomotor Activity: Normal   Assets  Assets:Communication Skills; Desire for Improvement; Financial Resources/Insurance; Housing; Resilience; Social Support   Sleep  Sleep:Sleep: Good   No data recorded  Physical Exam  Physical Exam Constitutional:      Appearance: Normal appearance.  Cardiovascular:     Rate  and Rhythm: Bradycardia present.  Pulmonary:     Effort: Pulmonary effort is normal. No respiratory distress.  Musculoskeletal:        General: Normal range of motion.     Cervical back: Normal range of motion.  Neurological:     General: No focal deficit present.     Mental Status: He is alert and oriented to person, place, and time.   Review of Systems  Constitutional: Negative.   Respiratory: Negative.    Cardiovascular: Negative.   Gastrointestinal: Negative.   Neurological: Negative.   Psychiatric/Behavioral:  Negative for depression, hallucinations, substance abuse (in early remission) and suicidal ideas. The patient is not nervous/anxious and does not have insomnia.   Blood pressure 112/81, pulse (!) 55, temperature 98.2 F (36.8 C), temperature source Oral, resp. rate 18, SpO2 99 %. There is no height or weight on file to calculate BMI.  Demographic Factors:  Male  Loss Factors: NA  Historical Factors: Impulsivity  Risk Reduction Factors:   Responsible for children under 95 years of age, Sense of responsibility to family, Living with another person, especially a relative, Positive social support, Positive therapeutic relationship, and Positive coping skills or problem solving skills  Continued Clinical Symptoms:  Alcohol/Substance Abuse/Dependencies in early remission Depression - restarted medications  Cognitive Features That Contribute To Risk:  None    Suicide Risk:  Minimal: No identifiable suicidal ideation.  Patients presenting with no risk factors but with morbid ruminations; may be classified as minimal risk based on the severity of the depressive symptoms  Plan Of Care/Follow-up recommendations:  Activity:  ad lib Diet:  as tolerated Other:  Follow-up with infectious diseases for Hepatitis C treatment.   Disposition:  Patient is instructed prior to discharge to: Take all medications as prescribed by his/her mental healthcare provider. A 30 day supply  of medications is provided. Report any adverse effects and or reactions from the medicines to his/her outpatient provider promptly. Keep all scheduled appointments, to ensure that you are getting refills on time and to avoid any interruption in your medication.  If you are unable to keep an appointment call to reschedule.  Be sure to follow-up with resources and follow-up appointments provided.  Patient has been instructed & cautioned: To not engage in alcohol and or illegal drug use while on prescription medicines. In the event of worsening symptoms, patient is instructed  to call the crisis hotline, 911 and or go to the nearest ED for appropriate evaluation and treatment of symptoms. To follow-up with his/her primary care provider for your other medical issues, concerns and or health care needs.    He was able to engage in safety planning including [facility] or contact emergency services if he feels unable to maintain his own safety or the safety of others. Patient had no further questions, comments, or concerns. Patient aware to return to nearest crisis center, ED or to call 911 for worsening symptoms of depression, suicidal or homicidal thoughts or AVH.   Mariel CraftSHEILA M Kamree Wiens, MD 06/25/2021, 11:39 AM

## 2021-06-25 NOTE — ED Notes (Signed)
Another snacks given

## 2021-06-30 ENCOUNTER — Encounter: Payer: Self-pay | Admitting: Infectious Diseases

## 2021-06-30 ENCOUNTER — Telehealth: Payer: Self-pay

## 2021-06-30 ENCOUNTER — Other Ambulatory Visit (HOSPITAL_COMMUNITY): Payer: Self-pay

## 2021-06-30 NOTE — Telephone Encounter (Signed)
[facility] Patient Advocate Encounter  Insurance verification completed.    The patient is insured through [place] [employer]  and has a 4.00 copay.  Medication will need a PA.  We will continue to follow to see if copay assistance is needed.  Mirra Basilio, CPhT Specialty Pharmacy Patient Advocate [facility] Phone: [phone] Fax:  [phone]  

## 2021-06-30 NOTE — Telephone Encounter (Signed)
Called patient to reschedule missed appointment. No answer. Phone rang many times, but never went to voicemail. Unable to leave message.  Wyvonne Lenz, RN

## 2021-07-01 LAB — HCV RNA DIAGNOSIS, NAA

## 2021-07-01 LAB — HCV RNA, QUANTITATIVE REFLEX
HCV RNA - Quantitation: 25700000 IU/mL
HCV RNA - log10: 7.41 log10 IU/mL

## 2021-07-01 LAB — HCV RT-PCR, QUANT (NON-GRAPH)

## 2021-07-01 LAB — HCV RNA (INTERNATIONAL UNITS)
HCV RNA (International Units): 12900000 IU/mL
HCV log10: 7.111 log10 IU/mL

## 2021-07-01 LAB — HCV AB W REFLEX TO QUANT PCR: HCV Ab: REACTIVE — AB

## 2021-07-14 ENCOUNTER — Inpatient Hospital Stay (HOSPITAL_COMMUNITY)
Admission: AD | Admit: 2021-07-14 | Discharge: 2021-07-17 | DRG: 885 | Disposition: A | Discharge status: Home / Self Care | Payer: Medicaid Other | Source: Intra-hospital | Attending: Psychiatry | Admitting: Psychiatry

## 2021-07-14 ENCOUNTER — Ambulatory Visit (HOSPITAL_COMMUNITY)
Admission: EM | Admit: 2021-07-14 | Discharge: 2021-07-14 | Disposition: A | Discharge status: Other Acute Inpatient Hospital | Payer: Medicaid Other | Attending: Psychiatry | Admitting: Psychiatry

## 2021-07-14 DIAGNOSIS — F1210 Cannabis abuse, uncomplicated: Secondary | ICD-10-CM | POA: Diagnosis present

## 2021-07-14 DIAGNOSIS — Z818 Family history of other mental and behavioral disorders: Secondary | ICD-10-CM

## 2021-07-14 DIAGNOSIS — F209 Schizophrenia, unspecified: Secondary | ICD-10-CM | POA: Diagnosis present

## 2021-07-14 DIAGNOSIS — F1510 Other stimulant abuse, uncomplicated: Secondary | ICD-10-CM | POA: Diagnosis present

## 2021-07-14 DIAGNOSIS — F319 Bipolar disorder, unspecified: Secondary | ICD-10-CM | POA: Diagnosis not present

## 2021-07-14 DIAGNOSIS — Z91013 Allergy to seafood: Secondary | ICD-10-CM | POA: Diagnosis not present

## 2021-07-14 DIAGNOSIS — F17200 Nicotine dependence, unspecified, uncomplicated: Secondary | ICD-10-CM | POA: Diagnosis present

## 2021-07-14 DIAGNOSIS — M545 Low back pain, unspecified: Secondary | ICD-10-CM | POA: Diagnosis present

## 2021-07-14 DIAGNOSIS — F17210 Nicotine dependence, cigarettes, uncomplicated: Secondary | ICD-10-CM | POA: Diagnosis present

## 2021-07-14 DIAGNOSIS — Z813 Family history of other psychoactive substance abuse and dependence: Secondary | ICD-10-CM | POA: Diagnosis not present

## 2021-07-14 DIAGNOSIS — G4700 Insomnia, unspecified: Secondary | ICD-10-CM | POA: Diagnosis present

## 2021-07-14 DIAGNOSIS — F39 Unspecified mood [affective] disorder: Secondary | ICD-10-CM

## 2021-07-14 DIAGNOSIS — Z888 Allergy status to other drugs, medicaments and biological substances status: Secondary | ICD-10-CM | POA: Diagnosis not present

## 2021-07-14 DIAGNOSIS — Z885 Allergy status to narcotic agent status: Secondary | ICD-10-CM

## 2021-07-14 DIAGNOSIS — F1590 Other stimulant use, unspecified, uncomplicated: Secondary | ICD-10-CM

## 2021-07-14 DIAGNOSIS — Z9151 Personal history of suicidal behavior: Secondary | ICD-10-CM

## 2021-07-14 DIAGNOSIS — Z634 Disappearance and death of family member: Secondary | ICD-10-CM

## 2021-07-14 DIAGNOSIS — R45851 Suicidal ideations: Secondary | ICD-10-CM | POA: Diagnosis not present

## 2021-07-14 DIAGNOSIS — B1920 Unspecified viral hepatitis C without hepatic coma: Secondary | ICD-10-CM | POA: Diagnosis present

## 2021-07-14 DIAGNOSIS — Z638 Other specified problems related to primary support group: Secondary | ICD-10-CM | POA: Diagnosis not present

## 2021-07-14 DIAGNOSIS — F314 Bipolar disorder, current episode depressed, severe, without psychotic features: Secondary | ICD-10-CM | POA: Diagnosis present

## 2021-07-14 DIAGNOSIS — Z20822 Contact with and (suspected) exposure to covid-19: Secondary | ICD-10-CM | POA: Insufficient documentation

## 2021-07-14 DIAGNOSIS — F4310 Post-traumatic stress disorder, unspecified: Secondary | ICD-10-CM | POA: Insufficient documentation

## 2021-07-14 DIAGNOSIS — F602 Antisocial personality disorder: Secondary | ICD-10-CM | POA: Diagnosis present

## 2021-07-14 DIAGNOSIS — F1994 Other psychoactive substance use, unspecified with psychoactive substance-induced mood disorder: Secondary | ICD-10-CM | POA: Insufficient documentation

## 2021-07-14 DIAGNOSIS — F1290 Cannabis use, unspecified, uncomplicated: Secondary | ICD-10-CM | POA: Diagnosis present

## 2021-07-14 LAB — CBC WITH DIFFERENTIAL/PLATELET
Abs Immature Granulocytes: 0.06 10*3/uL (ref 0.00–0.07)
Basophils Absolute: 0.1 10*3/uL (ref 0.0–0.1)
Basophils Relative: 1 %
Eosinophils Absolute: 0.2 10*3/uL (ref 0.0–0.5)
Eosinophils Relative: 2 %
HCT: 52.5 % — ABNORMAL HIGH (ref 39.0–52.0)
Hemoglobin: 17.7 g/dL — ABNORMAL HIGH (ref 13.0–17.0)
Immature Granulocytes: 1 %
Lymphocytes Relative: 24 %
Lymphs Abs: 2.5 10*3/uL (ref 0.7–4.0)
MCH: 31.5 pg (ref 26.0–34.0)
MCHC: 33.7 g/dL (ref 30.0–36.0)
MCV: 93.4 fL (ref 80.0–100.0)
Monocytes Absolute: 0.7 10*3/uL (ref 0.1–1.0)
Monocytes Relative: 7 %
Neutro Abs: 6.6 10*3/uL (ref 1.7–7.7)
Neutrophils Relative %: 65 %
Platelets: 305 10*3/uL (ref 150–400)
RBC: 5.62 MIL/uL (ref 4.22–5.81)
RDW: 13.8 % (ref 11.5–15.5)
WBC: 10.1 10*3/uL (ref 4.0–10.5)
nRBC: 0 % (ref 0.0–0.2)

## 2021-07-14 LAB — COMPREHENSIVE METABOLIC PANEL
ALT: 79 U/L — ABNORMAL HIGH (ref 0–44)
AST: 43 U/L — ABNORMAL HIGH (ref 15–41)
Albumin: 4.8 g/dL (ref 3.5–5.0)
Alkaline Phosphatase: 66 U/L (ref 38–126)
Anion gap: 10 (ref 5–15)
BUN: 12 mg/dL (ref 6–20)
CO2: 28 mmol/L (ref 22–32)
Calcium: 10.1 mg/dL (ref 8.9–10.3)
Chloride: 100 mmol/L (ref 98–111)
Creatinine, Ser: 0.91 mg/dL (ref 0.61–1.24)
GFR, Estimated: >60 mL/min (ref >60)
Glucose, Bld: 100 mg/dL — ABNORMAL HIGH (ref 70–99)
Potassium: 4.3 mmol/L (ref 3.5–5.1)
Sodium: 138 mmol/L (ref 135–145)
Total Bilirubin: 1 mg/dL (ref 0.3–1.2)
Total Protein: 8.8 g/dL — ABNORMAL HIGH (ref 6.5–8.1)

## 2021-07-14 LAB — RESP PANEL BY RT-PCR (FLU A&B, COVID) ARPGX2
Influenza A by PCR: NEGATIVE
Influenza B by PCR: NEGATIVE
SARS Coronavirus 2 by RT PCR: NEGATIVE

## 2021-07-14 LAB — POCT URINE DRUG SCREEN - MANUAL ENTRY (I-SCREEN)
POC Amphetamine UR: NOT DETECTED
POC Buprenorphine (BUP): NOT DETECTED
POC Cocaine UR: NOT DETECTED
POC Marijuana UR: POSITIVE — AB
POC Methadone UR: NOT DETECTED
POC Methamphetamine UR: NOT DETECTED
POC Morphine: NOT DETECTED
POC Oxazepam (BZO): NOT DETECTED
POC Oxycodone UR: NOT DETECTED
POC Secobarbital (BAR): NOT DETECTED

## 2021-07-14 LAB — URINALYSIS, ROUTINE W REFLEX MICROSCOPIC
Bilirubin Urine: NEGATIVE
Glucose, UA: NEGATIVE mg/dL
Hgb urine dipstick: NEGATIVE
Ketones, ur: NEGATIVE mg/dL
Leukocytes,Ua: NEGATIVE
Nitrite: NEGATIVE
Protein, ur: NEGATIVE mg/dL
Specific Gravity, Urine: 1.011 (ref 1.005–1.030)
pH: 6 (ref 5.0–8.0)

## 2021-07-14 LAB — LIPID PANEL
Cholesterol: 157 mg/dL (ref 0–200)
HDL: 68 mg/dL (ref >40)
LDL Cholesterol: 76 mg/dL (ref 0–99)
Total CHOL/HDL Ratio: 2.3 RATIO
Triglycerides: 64 mg/dL (ref <150)
VLDL: 13 mg/dL (ref 0–40)

## 2021-07-14 LAB — TSH: TSH: 0.905 u[IU]/mL (ref 0.350–4.500)

## 2021-07-14 LAB — ETHANOL: Alcohol, Ethyl (B): <10 mg/dL (ref <10)

## 2021-07-14 MED ORDER — MAGNESIUM HYDROXIDE 400 MG/5ML PO SUSP: 30.0000 mL | Freq: Every day | ORAL | Status: DC | PRN

## 2021-07-14 MED ORDER — TRAZODONE HCL 50 MG PO TABS: 50.0000 mg | ORAL_TABLET | Freq: Every evening | ORAL | Status: DC | PRN

## 2021-07-14 MED ORDER — ALUM & MAG HYDROXIDE-SIMETH 200-200-20 MG/5ML PO SUSP: 30.0000 mL | ORAL | Status: DC | PRN

## 2021-07-14 MED ORDER — NICOTINE 21 MG/24HR TD PT24
21.0000 mg | MEDICATED_PATCH | Freq: Every day | TRANSDERMAL | Status: DC
  Administered 2021-07-14: 21 mg via TRANSDERMAL
  Filled 2021-07-14: qty 1

## 2021-07-14 MED ORDER — ACETAMINOPHEN 325 MG PO TABS: 650.0000 mg | ORAL_TABLET | Freq: Four times a day (QID) | ORAL | Status: DC | PRN

## 2021-07-14 MED ORDER — GABAPENTIN 400 MG PO CAPS
400.0000 mg | ORAL_CAPSULE | Freq: Three times a day (TID) | ORAL | Status: DC
  Administered 2021-07-15 – 2021-07-17 (×8): 400 mg via ORAL
  Filled 2021-07-14 (×6): qty 1
  Filled 2021-07-14: qty 21
  Filled 2021-07-14 (×2): qty 1
  Filled 2021-07-14 (×2): qty 21
  Filled 2021-07-14 (×3): qty 1

## 2021-07-14 MED ORDER — GABAPENTIN 400 MG PO CAPS
400.0000 mg | ORAL_CAPSULE | Freq: Three times a day (TID) | ORAL | Status: DC
  Administered 2021-07-14 (×2): 400 mg via ORAL
  Filled 2021-07-14 (×2): qty 1

## 2021-07-14 MED ORDER — QUETIAPINE FUMARATE 50 MG PO TABS
50.0000 mg | ORAL_TABLET | Freq: Every day | ORAL | Status: DC
  Filled 2021-07-14 (×2): qty 1

## 2021-07-14 MED ORDER — HYDROXYZINE HCL 25 MG PO TABS
25.0000 mg | ORAL_TABLET | Freq: Three times a day (TID) | ORAL | Status: DC | PRN
  Administered 2021-07-15 – 2021-07-17 (×3): 25 mg via ORAL
  Filled 2021-07-14 (×3): qty 1

## 2021-07-14 MED ORDER — QUETIAPINE FUMARATE 50 MG PO TABS
50.0000 mg | ORAL_TABLET | Freq: Every day | ORAL | Status: DC
  Administered 2021-07-14: 50 mg via ORAL
  Filled 2021-07-14: qty 1

## 2021-07-14 MED ORDER — HYDROXYZINE HCL 25 MG PO TABS: 25.0000 mg | ORAL_TABLET | Freq: Three times a day (TID) | ORAL | Status: DC | PRN

## 2021-07-14 NOTE — Progress Notes (Signed)
Pt was accepted to [facility] 07/14/21; Bed Assignment 402-1  Pt meets inpatient criteria per Estella Husk, MD  Attending Physician will be Dr. Sherron Flemings  Report can be called to: -Adult unit: [phone]  Pt can arrive after 9:00pm  Care Team notified: [facility] Deary, RN, Earlene Plater, MD, and Milas Hock, RN.  Kelton Pillar, LCSWA 07/14/2021 @ 2:57 PM

## 2021-07-14 NOTE — Discharge Instructions (Signed)
Patient has been accepted to [facility] for continued treatment

## 2021-07-14 NOTE — Progress Notes (Incomplete)
Inpatient Behavioral Health Placement  Pt meets inpatient criteria per Dr. Bronwen Betters. There are no available beds at [facility] Referral was sent to the following facilities;    Destination Service Provider Address Phone Fax  [facility]  [address] [place], [place] [address] [phone]  [facility]  [address] [place] [address] [phone]  [facility]  [address] [place], [place] [address] [phone]  [facility]  [address] [place] [address] [phone]  [facility]  [address] [place] [address] [phone]  [facility]  [address], [place] [address] [phone]  [facility]  [address] [place] [address] [phone]  [facility]  [address], [place] [address] [phone]  [facility]  [address], [place] [address] [phone]  [facility]  [address] [place], [place] [address] [phone]  [facility]  [address], [place] [address] [phone]    Situation ongoing,  CSW will follow up.   Maryjean Ka, MSW, LCSWA 07/14/2021  @ 1:10 PM
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
## 2021-07-14 NOTE — Plan of Care (Signed)
  Problem: Education: Goal: Emotional status will improve Outcome: Not Progressing Goal: Mental status will improve Outcome: Not Progressing Goal: Verbalization of understanding the information provided will improve Outcome: Not Progressing   
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
## 2021-07-14 NOTE — Progress Notes (Signed)
   07/14/21 1013  [facility] Triage Screening (Walk-ins at [facility] only)  What Is the Reason for Your Visit/Call Today? Nathan Escobar is a 35-year-male present to [facility] via [facility], voluntary. Report he's suicidal with a plan to overdose on heroin and needs help for his addition to Meth and Heroin. Report symptoms of depression triggered by the upcoming anniversay of his he's wife death. Report he attempted to complete suicidal last week via jumping in front of a car but it just hurt his leg. Report he lives with his grandmother, however, he told her today he has to be move out due to lack of room, "there are 5 people living in a two bedroom house." Report hearing voices, denied homicidal ideations and denied visual hallucinations. Patient recently seen at [facility] on 06/23/2021 with similar compliants. Per chart review patient has history of inpatient hospitalizations 6+ times in past with most recent hospitalization 2 months ago.  How Long Has This Been Causing You Problems? <Week  Have You Recently Had Any Thoughts About Hurting Yourself? Yes  How long ago did you have thoughts about hurting yourself? week, report jumped in front of a car and hurt his leg in a suicidal intent.  Are You Planning to Commit Suicide/Harm Yourself At This time? No  Have you Recently Had Thoughts About Hurting Someone [name]? No  Are You Planning To Harm Someone At This Time? No  Are you currently experiencing any auditory, visual or other hallucinations? Yes  Please explain the hallucinations you are currently experiencing: Report auditory hallucinations  Have You Used Any Alcohol or Drugs in the Past 24 Hours? No (Report history of Meth and Heroin abuse states he has been clean for a week.)  Do you have any current medical co-morbidities that require immediate attention? No  Clinician description of patient physical appearance/behavior: Patient unshelved, cooperative and pleasant.  What Do You Feel Would Help You the Most  Today? Alcohol or Drug Use Treatment  If access to [facility] was not available, would you have sought care in the Emergency Department? No  Determination of Need Routine (7 days)  Options For Referral Outpatient Therapy;Medication Management
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
## 2021-07-15 ENCOUNTER — Encounter (HOSPITAL_COMMUNITY): Payer: Self-pay | Admitting: Psychiatry

## 2021-07-15 ENCOUNTER — Encounter (HOSPITAL_COMMUNITY): Payer: Self-pay

## 2021-07-15 ENCOUNTER — Other Ambulatory Visit: Payer: Self-pay

## 2021-07-15 DIAGNOSIS — F1994 Other psychoactive substance use, unspecified with psychoactive substance-induced mood disorder: Secondary | ICD-10-CM

## 2021-07-15 DIAGNOSIS — F1590 Other stimulant use, unspecified, uncomplicated: Secondary | ICD-10-CM | POA: Insufficient documentation

## 2021-07-15 DIAGNOSIS — F314 Bipolar disorder, current episode depressed, severe, without psychotic features: Principal | ICD-10-CM | POA: Diagnosis present

## 2021-07-15 DIAGNOSIS — F1290 Cannabis use, unspecified, uncomplicated: Secondary | ICD-10-CM | POA: Diagnosis present

## 2021-07-15 MED ORDER — QUETIAPINE FUMARATE 100 MG PO TABS
100.0000 mg | ORAL_TABLET | Freq: Every day | ORAL | Status: DC
  Administered 2021-07-15 – 2021-07-16 (×2): 100 mg via ORAL
  Filled 2021-07-15 (×2): qty 1
  Filled 2021-07-15: qty 7
  Filled 2021-07-15: qty 1

## 2021-07-15 MED ORDER — NICOTINE 14 MG/24HR TD PT24
14.0000 mg | MEDICATED_PATCH | Freq: Every day | TRANSDERMAL | Status: DC
  Administered 2021-07-15 – 2021-07-16 (×2): 14 mg via TRANSDERMAL
  Filled 2021-07-15 (×6): qty 1

## 2021-07-15 NOTE — Group Note (Signed)
Recreation Therapy Group Note   Group Topic:Team Building  Group Date: 07/15/2021 Start Time: 0935 End Time: 0955 Facilitators: Caroll Rancher, LRT,CTRS Location: [address] Dayroom   Goal Area(s) Addresses:  Patient will effectively work with peer towards shared goal.  Patient will identify skills used to make activity successful.  Patient will identify how skills used during activity can be applied to reach post d/c goals.   Group Description: [employer]. In teams of 5-6, patients were given 25 small craft pipe cleaners. Using the materials provided, patients were instructed to compete again the opposing team(s) to build the tallest free-standing structure from floor level. The activity was timed; difficulty increased by Clinical research associate as Production designer, theatre/television/film continued.  Systematically resources were removed with additional directions for example, placing one arm behind their back, working in silence, and shape stipulations. LRT facilitated post-activity discussion reviewing team processes and necessary communication skills involved in completion. Patients were encouraged to reflect how the skills utilized, or not utilized, in this activity can be incorporated to positively impact support systems post discharge.   Affect/Mood: N/A   Participation Level: Did not attend    Clinical Observations/Individualized Feedback:     Plan: Continue to engage patient in RT group sessions 2-3x/week.   Caroll Rancher, LRT,CTRS 07/15/2021 1:09 PM

## 2021-07-15 NOTE — H&P (Signed)
Psychiatric Admission Assessment Adult  Patient Identification: Nathan Escobar MRN:  [id number]  Date of Evaluation:  07/15/2021  Chief Complaint: Worsening depression, anxiety & auditory hallucinations telling him to hurt himself.   Principal Diagnosis: Substance induced mood disorder (HCC)  Diagnosis:  Principal Problem:   Substance induced mood disorder (HCC)  History of Present Illness: This is the first psychiatric admission in this [facility] for the 36 year old male with hx of chronic mental illnesses, substance use disorders (opioid, methamphetamine & THC), multiple psychiatric admissions & outpatient psychiatric services. He is admitted to the [facility] from the [facility] with complain of worsening suicidal ideations with plan to overdose on opioid drug. Chart review reports indicated that patient actually attempted suicide last week. He was complaining of low mood, worsening command auditory hallucinations telling him to harm himself & visual hallucinations (seeing spiders). After Evaluation at the [facility], he was transferred to the [facility] for further psychiatric evaluation/treatments. During this evaluation, [name] reports,   "I'm not doing well mentally. I have bad mental health issues (bipolar disorder & schizophrenia).  I have had mental health issues my whole life & several psychiatric admissions at [facility], [facility] & a psychiatric hospital in [place] a while ago. I was diagnosed with mental illness when I was 36 years old. Over the years, I have been on several mental health medications for my illness. I believe I benefited more from Depakote. I have also used a lot of drugs in my life. I was doing okay until last 07-05-2020, my wife died of heroin overdose. It was an intentional overdose. And what was worst, her 18 year old son found her her body. Although my wife's death has given me a lot of grief, I still cannot deal with knowing that my step-son found his mother's body. I  can't get that off my mind. After my wife passed, I reconnected with my childhood friend & we have been friends again since last year. However, few days ago, we got into an argument, said some bad stuff to each other. I got mad & left [place] where she lives. I came down to [place]. And because last month was the 1 year anniversary death of my wife, I have not been doing well. All of a sudden, I noticed that my mood is going down & down. I started hearing voices telling me to go ahead & harm myself. My anxiety got very bad & I have not been sleeping well at night. I started substance abuse at age 81. I was on heroin, have not used since my wife overdosed on it. I still use crystal meth. I last used it a week ago. But I smoke weed every chance I get up to 5 times a day. I was feeling bad when I got here, but today, I feel happy, excited to be here. I would like to stay as long as I can possibly stay in this hospital. I'm not feeling suicidal or homicidal today. I'm not seeing things, but the voices are still there telling me to harm myself. I have attempted suicide bunch of times over the years. There is one completed suicide in my family. My paternal aunt completed suicide. Both bipolar disorder & schizophrenia including substance addictions run deep on both sides of my family. I will also need you guys to start me on hepatitis C medicine".  On this evaluation: Nathan Escobar presents alert, oriented x 4. He is verbally responsive. Presents as a  good historian & able to make needs know. Although reporting all the symptoms listed above, his outlook, mannerism & presentation are not congruent. He thinks he may benefit from Depakote regimen again, however, he is explained that because of his hepatitis C & elevated liver enzymes, this will not be a good choice of medication for his symptoms. He is already on Seroquel 50 mg Q hs, this was increased to 100 mg. He is resumed on gabapentin for low back pain. Rylin is explained  that he will have to go to the Health department or the infectious disease clinic to see about his Hep C treatments as we do not initiate the treatment here at [facility]. Reviewed current lab result: Liver enzymes (AST -43, ALT -79, total protein -8.8, Hemp globin 17.7, Gluc -100, Hgba1c 5.4. UDS (+) for THC. Vital signs are stable.    Associated Signs/Symptoms:  Depression Symptoms:  depressed mood, insomnia, difficulty concentrating, anxiety,  Duration of Depression Symptoms: Greater than two weeks  (Hypo) Manic Symptoms:  Hallucinations, Impulsivity, Irritable Mood, Labiality of Mood,  Anxiety Symptoms:  Excessive Worry,  Psychotic Symptoms:  Hallucinations: auditory, command in nature (telling him to kill himself".  PTSD Symptoms: "I have been incarcerated, seen scary stuff. My wife a year ago this month from heroin overdose. She intentionally overdosed & her 88 year old son found her". Re-experiencing:  Intrusive Thoughts  Total Time spent with patient: 1 hour  Past Psychiatric History: Bipolar disorder, Schizophrenia.  Is the patient at risk to self? No.  Has the patient been a risk to self in the past 6 months? Yes.    Has the patient been a risk to self within the distant past? Yes.    Is the patient a risk to others? No.  Has the patient been a risk to others in the past 6 months? No.  Has the patient been a risk to others within the distant past? No.   Prior Inpatient Therapy: Yes [employer], [facility], a psychiatric hospital in [place]).  Prior Outpatient Therapy: Yes  Alcohol Screening: 1. How often do you have a drink containing alcohol?: Never 2. How many drinks containing alcohol do you have on a typical day when you are drinking?: 1 or 2 3. How often do you have six or more drinks on one occasion?: Never AUDIT-C Score: 0 4. How often during the last year have you found that you were not able to stop drinking once you had started?: Never 5. How often  during the last year have you failed to do what was normally expected from you because of drinking?: Never 6. How often during the last year have you needed a first drink in the morning to get yourself going after a heavy drinking session?: Never 7. How often during the last year have you had a feeling of guilt of remorse after drinking?: Never 8. How often during the last year have you been unable to remember what happened the night before because you had been drinking?: Never 9. Have you or someone else been injured as a result of your drinking?: No 10. Has a relative or friend or a doctor or another health worker been concerned about your drinking or suggested you cut down?: No Alcohol Use Disorder Identification Test Final Score (AUDIT): 0  Substance Abuse History in the last 12 months:  Yes.    Consequences of Substance Abuse: Discussed witg patient during this admission evaluation. Medical Consequences:  Liver damage, Possible death by  overdose Legal Consequences:  Arrests, jail time, Loss of driving privilege. Family Consequences:  Family discord, divorce and or separation.   Previous Psychotropic Medications:  Prozac, Seroquel.  Psychological Evaluations: No   Past Medical History:  Past Medical History:  Diagnosis Date   Anxiety    Depression    Hepatitis C    Heroin abuse (HCC)    IVDU (intravenous drug user)    Marijuana use    Methamphetamine abuse (HCC)    PTSD (post-traumatic stress disorder)    History reviewed. No pertinent surgical history.  Family History:  Family History  Problem Relation Age of Onset   Depression Mother    Drug abuse Father    Bipolar disorder Father    Suicidality Paternal Aunt    Bipolar disorder Paternal Aunt    Family Psychiatric  History: Bipolar disorder, Schizophrenia run on bother sides of the family.  Tobacco Screening: Smokes a pack & half of cigarettes daily.  Social History:  Social History   Substance and Sexual  Activity  Alcohol Use None     Social History   Substance and Sexual Activity  Drug Use Yes   Types: Heroin, Methamphetamines, Marijuana    Additional Social History:   Allergies:   Allergies  Allergen Reactions   Fish Allergy Anaphylaxis   Shellfish Allergy Anaphylaxis   Tramadol Hives   Ketorolac Nausea And Vomiting and Hives   Lab Results:  Results for orders placed or performed during the hospital encounter of 07/14/21 (from the past 48 hour(s))  Urinalysis, Routine w reflex microscopic     Status: None   Collection Time: 07/14/21 11:04 AM  Result Value Ref Range   Color, Urine YELLOW YELLOW   APPearance CLEAR CLEAR   Specific Gravity, Urine 1.011 1.005 - 1.030   pH 6.0 5.0 - 8.0   Glucose, UA NEGATIVE NEGATIVE mg/dL   Hgb urine dipstick NEGATIVE NEGATIVE   Bilirubin Urine NEGATIVE NEGATIVE   Ketones, ur NEGATIVE NEGATIVE mg/dL   Protein, ur NEGATIVE NEGATIVE mg/dL   Nitrite NEGATIVE NEGATIVE   Leukocytes,Ua NEGATIVE NEGATIVE    Comment: Performed at [facility], [address] [place], [place] [address]  Resp Panel by RT-PCR (Flu A&B, Covid) Anterior Nasal Swab     Status: None   Collection Time: 07/14/21 11:09 AM   Specimen: Anterior Nasal Swab  Result Value Ref Range   SARS Coronavirus 2 by RT PCR NEGATIVE NEGATIVE    Comment: (NOTE) SARS-CoV-2 target nucleic acids are NOT DETECTED.  The SARS-CoV-2 RNA is generally detectable in upper respiratory specimens during the acute phase of infection. The lowest concentration of SARS-CoV-2 viral copies this assay can detect is 138 copies/mL. A negative result does not preclude SARS-Cov-2 infection and should not be used as the sole basis for treatment or other patient management decisions. A negative result may occur with  improper specimen collection/handling, submission of specimen other than nasopharyngeal swab, presence of viral mutation(s) within the areas targeted by this assay, and inadequate number of  viral copies(<138 copies/mL). A negative result must be combined with clinical observations, patient history, and epidemiological information. The expected result is Negative.  Fact Sheet for Patients:  BloggerCourse.com  Fact Sheet for Healthcare Providers:  SeriousBroker.it  This test is no t yet approved or cleared by the [place] FDA and  has been authorized for detection and/or diagnosis of SARS-CoV-2 by FDA under an Emergency Use Authorization (EUA). This EUA will remain  in effect (meaning this  test can be used) for the duration of the COVID-19 declaration under Section 564(b)(1) of the Act, 21 U.S.C.section 360bbb-3(b)(1), unless the authorization is terminated  or revoked sooner.       Influenza A by PCR NEGATIVE NEGATIVE   Influenza B by PCR NEGATIVE NEGATIVE    Comment: (NOTE) The Xpert Xpress SARS-CoV-2/FLU/RSV plus assay is intended as an aid in the diagnosis of influenza from Nasopharyngeal swab specimens and should not be used as a sole basis for treatment. Nasal washings and aspirates are unacceptable for Xpert Xpress SARS-CoV-2/FLU/RSV testing.  Fact Sheet for Patients: BloggerCourse.com  Fact Sheet for Healthcare Providers: SeriousBroker.it  This test is not yet approved or cleared by the [place] FDA and has been authorized for detection and/or diagnosis of SARS-CoV-2 by FDA under an Emergency Use Authorization (EUA). This EUA will remain in effect (meaning this test can be used) for the duration of the COVID-19 declaration under Section 564(b)(1) of the Act, 21 U.S.C. section 360bbb-3(b)(1), unless the authorization is terminated or revoked.  Performed at [facility], [address] [place], [place] [address]   CBC with Differential/Platelet     Status: Abnormal   Collection Time: 07/14/21 11:09 AM  Result Value Ref Range   WBC 10.1  4.0 - 10.5 K/uL   RBC 5.62 4.22 - 5.81 MIL/uL   Hemoglobin 17.7 (H) 13.0 - 17.0 g/dL   HCT 47.8 (H) 29.5 - 62.1 %   MCV 93.4 80.0 - 100.0 fL   MCH 31.5 26.0 - 34.0 pg   MCHC 33.7 30.0 - 36.0 g/dL   RDW 30.8 65.7 - 84.6 %   Platelets 305 150 - 400 K/uL   nRBC 0.0 0.0 - 0.2 %   Neutrophils Relative % 65 %   Neutro Abs 6.6 1.7 - 7.7 K/uL   Lymphocytes Relative 24 %   Lymphs Abs 2.5 0.7 - 4.0 K/uL   Monocytes Relative 7 %   Monocytes Absolute 0.7 0.1 - 1.0 K/uL   Eosinophils Relative 2 %   Eosinophils Absolute 0.2 0.0 - 0.5 K/uL   Basophils Relative 1 %   Basophils Absolute 0.1 0.0 - 0.1 K/uL   Immature Granulocytes 1 %   Abs Immature Granulocytes 0.06 0.00 - 0.07 K/uL    Comment: Performed at [facility], [address], [place], [place] [address]  Comprehensive metabolic panel     Status: Abnormal   Collection Time: 07/14/21 11:09 AM  Result Value Ref Range   Sodium 138 135 - 145 mmol/L   Potassium 4.3 3.5 - 5.1 mmol/L   Chloride 100 98 - 111 mmol/L   CO2 28 22 - 32 mmol/L   Glucose, Bld 100 (H) 70 - 99 mg/dL    Comment: Glucose reference range applies only to samples taken after fasting for at least 8 hours.   BUN 12 6 - 20 mg/dL   Creatinine, Ser 2.84 0.61 - 1.24 mg/dL   Calcium 13.2 8.9 - 44.0 mg/dL   Total Protein 8.8 (H) 6.5 - 8.1 g/dL   Albumin 4.8 3.5 - 5.0 g/dL   AST 43 (H) 15 - 41 U/L   ALT 79 (H) 0 - 44 U/L   Alkaline Phosphatase 66 38 - 126 U/L   Total Bilirubin 1.0 0.3 - 1.2 mg/dL   GFR, Estimated >10 >27 mL/min    Comment: (NOTE) Calculated using the CKD-EPI Creatinine Equation (2021)    Anion gap 10 5 - 15    Comment:  Performed at [facility], [address] [place], [place] [address]  Ethanol     Status: None   Collection Time: 07/14/21 11:09 AM  Result Value Ref Range   Alcohol, Ethyl (B) <10 <10 mg/dL    Comment: (NOTE) Lowest detectable limit for serum alcohol is 10 mg/dL.  For medical purposes only. Performed at [facility], [address] [place], [place] [address]   TSH     Status: None   Collection Time: 07/14/21 11:09 AM  Result Value Ref Range   TSH 0.905 0.350 - 4.500 uIU/mL    Comment: Performed by a 3rd Generation assay with a functional sensitivity of <=0.01 uIU/mL. Performed at [facility], [address] [place], [place] [address]   Lipid panel     Status: None   Collection Time: 07/14/21 11:09 AM  Result Value Ref Range   Cholesterol 157 0 - 200 mg/dL   Triglycerides 64 <130<150 mg/dL   HDL 68 >86>40 mg/dL   Total CHOL/HDL Ratio 2.3 RATIO   VLDL 13 0 - 40 mg/dL   LDL Cholesterol 76 0 - 99 mg/dL    Comment:        Total Cholesterol/HDL:CHD Risk Coronary Heart Disease Risk Table                     Men   Women  1/2 Average Risk   3.4   3.3  Average Risk       5.0   4.4  2 X Average Risk   9.6   7.1  3 X Average Risk  23.4   11.0        Use the calculated Patient Ratio above and the CHD Risk Table to determine the patient's CHD Risk.        ATP III CLASSIFICATION (LDL):  <100     mg/dL   Optimal  [id number]  mg/dL   Near or Above                    Optimal  130-159  mg/dL   Borderline  [id number]  mg/dL   High  >413>190     mg/dL   Very High Performed at [facility], [address] [place], [place] [address]   POCT Urine Drug Screen - (I-Screen)     Status: Abnormal   Collection Time: 07/14/21 11:14 AM  Result Value Ref Range   POC Amphetamine UR None Detected NONE DETECTED (Cut Off Level 1000 ng/mL)   POC Secobarbital (BAR) None Detected NONE DETECTED (Cut Off Level 300 ng/mL)   POC Buprenorphine (BUP) None Detected NONE DETECTED (Cut Off Level 10 ng/mL)   POC Oxazepam (BZO) None Detected NONE DETECTED (Cut Off Level 300 ng/mL)   POC Cocaine UR None Detected NONE DETECTED (Cut Off Level 300 ng/mL)   POC Methamphetamine UR None Detected NONE DETECTED (Cut Off Level 1000 ng/mL)   POC Morphine None Detected NONE DETECTED (Cut Off Level 300 ng/mL)   POC Methadone UR None  Detected NONE DETECTED (Cut Off Level 300 ng/mL)   POC Oxycodone UR None Detected NONE DETECTED (Cut Off Level 100 ng/mL)   POC Marijuana UR Positive (A) NONE DETECTED (Cut Off Level 50 ng/mL)   Blood Alcohol level:  Lab Results  Component Value Date   [facility] <10 07/14/2021   ETH <10 06/23/2021   Metabolic Disorder Labs:  Lab Results  Component Value Date  HGBA1C 5.4 06/23/2021   MPG 108.28 06/23/2021   No results found for: "PROLACTIN" Lab Results  Component Value Date   CHOL 157 07/14/2021   TRIG 64 07/14/2021   HDL 68 07/14/2021   CHOLHDL 2.3 07/14/2021   VLDL 13 07/14/2021   LDLCALC 76 07/14/2021   LDLCALC 17 06/23/2021   Current Medications: Current Facility-Administered Medications  Medication Dose Route Frequency Provider Last Rate Last Admin   acetaminophen (TYLENOL) tablet 650 mg  650 mg Oral Q6H PRN Estella Husk, MD       alum & mag hydroxide-simeth (MAALOX/MYLANTA) 200-200-20 MG/5ML suspension 30 mL  30 mL Oral Q4H PRN Estella Husk, MD       gabapentin (NEURONTIN) capsule 400 mg  400 mg Oral TID Estella Husk, MD   400 mg at 07/15/21 1610   hydrOXYzine (ATARAX) tablet 25 mg  25 mg Oral TID PRN Estella Husk, MD       magnesium hydroxide (MILK OF MAGNESIA) suspension 30 mL  30 mL Oral Daily PRN Estella Husk, MD       nicotine (NICODERM CQ - dosed in mg/24 hours) patch 14 mg  14 mg Transdermal Daily Sindy Guadeloupe, NP   14 mg at 07/15/21 9604   QUEtiapine (SEROQUEL) tablet 50 mg  50 mg Oral QHS Estella Husk, MD       traZODone (DESYREL) tablet 50 mg  50 mg Oral QHS PRN Estella Husk, MD       PTA Medications: Medications Prior to Admission  Medication Sig Dispense Refill Last Dose   gabapentin (NEURONTIN) 400 MG capsule Take 1 capsule (400 mg total) by mouth 3 (three) times daily. 90 capsule 0    QUEtiapine (SEROQUEL) 50 MG tablet Take 1 tablet (50 mg total) by mouth at bedtime. 30 tablet 0     Musculoskeletal: Strength & Muscle Tone: within normal limits Gait & Station: normal Patient leans: N/A  Psychiatric Specialty Exam:  Presentation  General Appearance: Appropriate for Environment; Disheveled  Eye Contact:Good  Speech:Clear and Coherent; Normal Rate  Speech Volume:Normal  Handedness:Right   Mood and Affect  Mood:Depressed  Affect:Appropriate; Congruent   Thought Process  Thought Processes:Coherent; Goal Directed; Linear  Duration of Psychotic Symptoms: N/A  Past Diagnosis of Schizophrenia or Psychoactive disorder: No  Descriptions of Associations:Intact  Orientation:Full (Time, Place and Person)  Thought Content:Logical  Hallucinations:Hallucinations: Command; Visual Description of Command Hallucinations: CAH instructing him to harm himseld Description of Auditory Hallucinations: CAH instructing him to harm himself Description of Visual Hallucinations: "spiders"  Ideas of Reference:Paranoia (vague paranoia)  Suicidal Thoughts:Suicidal Thoughts: Yes, Active SI Active Intent and/or Plan: With Intent; With Plan; With Means to Carry Out; With Access to Means ("OD on heroin")  Homicidal Thoughts:Homicidal Thoughts: No  Sensorium  Memory:Recent Good; Remote Good  Judgment:Intact  Insight:Present (had insight to present to hospital seeking assistance for suicidal thoughts)  Executive Functions  Concentration:Good  Attention Span:Good  Recall:Good  Fund of Knowledge:Good  Language:Good  Psychomotor Activity  Psychomotor Activity:Psychomotor Activity: Normal  Assets  Assets:Communication Skills; Desire for Improvement; Resilience  Sleep  Sleep:Sleep: Fair  Physical Exam: Physical Exam Vitals and nursing note reviewed.  HENT:     Nose: Nose normal.     Mouth/Throat:     Pharynx: Oropharynx is clear.  Cardiovascular:     Rate and Rhythm: Normal rate.     Pulses: Normal pulses.  Pulmonary:     Effort: Pulmonary effort is  normal.  Genitourinary:  Comments: Deferred Musculoskeletal:        General: Normal range of motion.     Cervical back: Normal range of motion.  Skin:    General: Skin is warm and dry.  Neurological:     General: No focal deficit present.     Mental Status: He is alert and oriented to person, place, and time.   Review of Systems  Constitutional:  Negative for chills, diaphoresis and fever.  HENT:  Negative for congestion and sore throat.   Respiratory:  Negative for cough, shortness of breath and wheezing.   Cardiovascular:  Negative for chest pain and palpitations.  Gastrointestinal:  Negative for abdominal pain, constipation, diarrhea, heartburn, nausea and vomiting.  Neurological:  Negative for dizziness, tingling, tremors, sensory change, speech change, focal weakness, seizures, loss of consciousness, weakness and headaches.  Endo/Heme/Allergies:        Allergies: Tramadol, Toradol (Ketorolac).  Food allergy: Shellfish  Psychiatric/Behavioral:  Positive for depression, hallucinations, substance abuse and suicidal ideas. Negative for memory loss. The patient is nervous/anxious and has insomnia.    Blood pressure 121/79, pulse 94, temperature 98.7 F (37.1 C), temperature source Oral, resp. rate 18, height  (1.6 m), weight 65.3 kg, SpO2 98 %. Body mass index is 25.51 kg/m.  Treatment Plan Summary: Daily contact with patient to assess and evaluate symptoms and progress in treatment and Medication management.  Continue inpatient hospitalization.   Diagnoses:  Schizophrenia.  Stimulant use disorder (Methamphetamine).   Plan. - Increased Seroquel from 50 mg to 100 mg po Q hs for schizophrenia.. - Continue gabapentin 400 mg po tid prn for anxiety/pain episodes.  - Continue Hydroxyzine 25 mg po tid prn for anxiety. - Continue Trazodone 50 mg po Q hs prn for insomnia.  - Continue Nicotine patch 14 mg trans-dermally Q 24 hrs for nicotine withdrawal.  Other prn  medications.  - Mylanta 30 ml po Q 6 hours prn for indigestion.  - MOM 30 ml po q daily prn for constipation.   Discharge Planning: Social work and case management to assist with discharge planning and identification of hospital follow-up needs prior to discharge Estimated LOS: 5-7 days Discharge Concerns: Need to establish a safety plan; Medication compliance and effectiveness Discharge Goals: Return home with outpatient referrals for mental health follow-up including medication management/psychotherapy  Observation Level/Precautions:  15 minute checks  Laboratory:   Per ED, current lab results reviewed. Presenst with elevated liver enzymes (hep C +).  Psychotherapy: Enrolled in group sessions    Medications: See MAR.   Consultations:  As needed.  Discharge Concerns: Safety, mood stability.   Estimated LOS: 3-5 days  Other: NA   Physician Treatment Plan for Primary Diagnosis: Substance induced mood disorder (HCC)  Long Term Goal(s): Improvement in symptoms so as ready for discharge  Short Term Goals: Ability to identify changes in lifestyle to reduce recurrence of condition will improve, Ability to verbalize feelings will improve, Ability to disclose and discuss suicidal ideas, and Ability to demonstrate self-control will improve  Physician Treatment Plan for Secondary Diagnosis: Principal Problem:   Substance induced mood disorder (HCC)  Long Term Goal(s): Improvement in symptoms so as ready for discharge  Short Term Goals: Ability to identify and develop effective coping behaviors will improve, Ability to maintain clinical measurements within normal limits will improve, Compliance with prescribed medications will improve, and Ability to identify triggers associated with substance abuse/mental health issues will improve  I certify that inpatient services furnished can reasonably be expected to  improve the patient's condition.    Armandina Stammer, NP, [id number]-bc, fnp-bc 6/14/202310:13  AM

## 2021-07-15 NOTE — Progress Notes (Addendum)
D: Patient presents with flat, blunted affect. His main concern today is his anxiety. Patient denies any thoughts of self harm. He denies any AVH. Patient is pleasant with staff. He forwards little information re his admission.  A: Continue to monitor medication management and MD orders.  Safety checks completed every 15 minutes per protocol.  Offer support and encouragement as needed.  R: Patient is receptive to staff; his behavior is appropriate.     07/15/21 0900  Psych Admission Type (Psych Patients Only)  Admission Status Voluntary  Psychosocial Assessment  Patient Complaints Anxiety;Depression  Eye Contact Brief  Facial Expression Flat  Affect Labile  Speech Logical/coherent  Interaction Forwards little;Minimal  Motor Activity Other (Comment) (wnl)  Appearance/Hygiene Disheveled  Behavior Characteristics Cooperative  Mood Depressed;Anxious  Thought Process  Coherency WDL  Content WDL  Delusions None reported or observed  Perception WDL  Hallucination None reported or observed  Judgment Impaired  Confusion WDL  Danger to Self  Current suicidal ideation? Denies  Agreement Not to Harm Self Yes  Description of Agreement verbal  Danger to Others  Danger to Others None reported or observed

## 2021-07-15 NOTE — Group Note (Signed)
Date:  07/15/2021 Time:  11:18 AM  Group Topic/Focus: Mindfulness Managing Feelings:   The focus of this group is to identify what feelings patients have difficulty handling and develop a plan to handle them in a healthier way upon discharge.    Participation Level:  Active  Participation Quality:  Appropriate  Affect:  Appropriate  Cognitive:  Appropriate  Insight: Appropriate  Engagement in Group:  Engaged  Modes of Intervention:  Discussion  Additional Comments:  Pt was engaged and participated in group discussion  Kelvin Cellar 07/15/2021, 11:18 AM

## 2021-07-15 NOTE — Tx Team (Signed)
Initial Treatment Plan 07/15/2021 12:07 AM Kendall Flack [id number]    PATIENT STRESSORS: Loss of wife of 7 ys. To Heroin Overdose 1 y ago   Other: Not being with my baby, "[name]" (girlfriend)     PATIENT STRENGTHS: General fund of knowledge  Motivation for treatment/growth    PATIENT IDENTIFIED PROBLEMS: Suicidal  Depression  Anxiety  Substance Abuse      "Help with my anger"  "Help with my depression"       DISCHARGE CRITERIA:  Improved stabilization in mood, thinking, and/or behavior Reduction of life-threatening or endangering symptoms to within safe limits  PRELIMINARY DISCHARGE PLAN: Outpatient therapy  PATIENT/FAMILY INVOLVEMENT: This treatment plan has been presented to and reviewed with the patient, Claxton Lyu.  The patient and family have been given the opportunity to ask questions and make suggestions.  Maryellen Pile, RN 07/15/2021, 12:07 AM

## 2021-07-15 NOTE — B[facility] Group Notes (Signed)
Patient attended and participated in the NA group. ?
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
## 2021-07-15 NOTE — Group Note (Signed)
LCSW Group Therapy Note   Group Date: 07/15/2021 Start Time: 1300 End Time: 1400  Type of Therapy and Topic:  Group Therapy:  Stress Management   Participation Level:  Active    Description of Group:  Patients in this group were introduced to the idea of stress and encouraged to discuss negative and positive ways to manage stress. Patients discussed specific stressors that they have in their life right now and the physical signs and symptoms associated with that stress.  Patient encouraged to come up with positive changes to assist with the stress upon discharge in order to prevent future hospitalizations.   They also worked as a group on developing a specific plan for several patients to deal with stressors through boundary-setting, psychoeducation and self care techniques   Therapeutic Goals:               1)  To discuss the positive and negative impacts of stress             2)  identify signs and symptoms of stress             3)  generate ideas for stress management             4)  offer mutual support to others regarding stress management             5)  Developing plans for ways to manage specific stressors upon discharge               Summary of Patient Progress:  Pt attended group and remained there the entire time.  The Pt accepted all worksheets and followed along throughout the group session.  The Pt demonstrated understanding of the information being discussed and was appropriate with their peers.    Therapeutic Modalities:   Motivational Interviewing Brief Solution-Focused Therapy  Maleeya Peterkin M Kortland Nichols, LCSWA 07/15/2021  2:06 PM    

## 2021-07-15 NOTE — Progress Notes (Signed)
     07/14/21 2220  Psych Admission Type (Psych Patients Only)  Admission Status Voluntary  Psychosocial Assessment  Patient Complaints Anxiety;Depression;Loneliness;Sadness;Substance abuse  Eye Contact Brief  Facial Expression Flat  Affect Depressed  Speech Logical/coherent  Interaction Forwards little;Minimal  Motor Activity Other (Comment) (WNL)  Appearance/Hygiene In scrubs  Behavior Characteristics Cooperative  Mood Depressed;Anxious  Thought Process  Coherency WDL  Content WDL  Delusions None reported or observed  Perception Hallucinations  Hallucination None reported or observed  Judgment Poor  Confusion WDL  Danger to Self  Current suicidal ideation?  (Denies)  Description of Suicide Plan plan to walk out into traffic  Agreement Not to Harm Self Yes  Description of Agreement verbally contracted for safety  Danger to Others  Danger to Others None reported or observed

## 2021-07-15 NOTE — BH IP Treatment Plan (Signed)
Interdisciplinary Treatment and Diagnostic Plan Update  07/15/2021 Time of Session: 9:40am  Nathan Escobar MRN: [id number]  Principal Diagnosis: Substance induced mood disorder ([place])  Secondary Diagnoses: Principal Problem:   Substance induced mood disorder ([place])   Current Medications:  Current Facility-Administered Medications  Medication Dose Route Frequency Provider Last Rate Last Admin   acetaminophen (TYLENOL) tablet 650 mg  650 mg Oral Q6H PRN Ival Bible, MD       alum & mag hydroxide-simeth (MAALOX/MYLANTA) 200-200-20 MG/5ML suspension 30 mL  30 mL Oral Q4H PRN Ival Bible, MD       gabapentin (NEURONTIN) capsule 400 mg  400 mg Oral TID Ival Bible, MD   400 mg at 07/15/21 1155   hydrOXYzine (ATARAX) tablet 25 mg  25 mg Oral TID PRN Ival Bible, MD   25 mg at 07/15/21 1154   magnesium hydroxide (MILK OF MAGNESIA) suspension 30 mL  30 mL Oral Daily PRN Ival Bible, MD       nicotine (NICODERM CQ - dosed in mg/24 hours) patch 14 mg  14 mg Transdermal Daily Evette Georges, NP   14 mg at 07/15/21 5670   QUEtiapine (SEROQUEL) tablet 100 mg  100 mg Oral QHS Lindell Spar I, NP       traZODone (DESYREL) tablet 50 mg  50 mg Oral QHS PRN Ival Bible, MD       PTA Medications: Medications Prior to Admission  Medication Sig Dispense Refill Last Dose   gabapentin (NEURONTIN) 400 MG capsule Take 1 capsule (400 mg total) by mouth 3 (three) times daily. 90 capsule 0    QUEtiapine (SEROQUEL) 50 MG tablet Take 1 tablet (50 mg total) by mouth at bedtime. 30 tablet 0     Patient Stressors: Loss of wife of 67 ys. To Heroin Overdose 1 y ago   Other: Not being with my baby, "[name]" (girlfriend)    Patient Strengths: General fund of knowledge  Motivation for treatment/growth   Treatment Modalities: Medication Management, Group therapy, Case management,  1 to 1 session with clinician, Psychoeducation, Recreational therapy.   Physician  Treatment Plan for Primary Diagnosis: Substance induced mood disorder ([place]) Long Term Goal(s): Improvement in symptoms so as ready for discharge   Short Term Goals: Ability to identify and develop effective coping behaviors will improve Ability to maintain clinical measurements within normal limits will improve Compliance with prescribed medications will improve Ability to identify triggers associated with substance abuse/mental health issues will improve Ability to identify changes in lifestyle to reduce recurrence of condition will improve Ability to verbalize feelings will improve Ability to disclose and discuss suicidal ideas Ability to demonstrate self-control will improve  Medication Management: Evaluate patient's response, side effects, and tolerance of medication regimen.  Therapeutic Interventions: 1 to 1 sessions, Unit Group sessions and Medication administration.  Evaluation of Outcomes: Not Met  Physician Treatment Plan for Secondary Diagnosis: Principal Problem:   Substance induced mood disorder ([place])  Long Term Goal(s): Improvement in symptoms so as ready for discharge   Short Term Goals: Ability to identify and develop effective coping behaviors will improve Ability to maintain clinical measurements within normal limits will improve Compliance with prescribed medications will improve Ability to identify triggers associated with substance abuse/mental health issues will improve Ability to identify changes in lifestyle to reduce recurrence of condition will improve Ability to verbalize feelings will improve Ability to disclose and discuss suicidal ideas Ability to demonstrate self-control will improve  Medication Management: Evaluate patient's response, side effects, and tolerance of medication regimen.  Therapeutic Interventions: 1 to 1 sessions, Unit Group sessions and Medication administration.  Evaluation of Outcomes: Not Met   RN Treatment Plan for Primary  Diagnosis: Substance induced mood disorder ([place]) Long Term Goal(s): Knowledge of disease and therapeutic regimen to maintain health will improve  Short Term Goals: Ability to remain free from injury will improve, Ability to participate in decision making will improve, Ability to verbalize feelings will improve, Ability to disclose and discuss suicidal ideas, and Ability to identify and develop effective coping behaviors will improve  Medication Management: RN will administer medications as ordered by provider, will assess and evaluate patient's response and provide education to patient for prescribed medication. RN will report any adverse and/or side effects to prescribing provider.  Therapeutic Interventions: 1 on 1 counseling sessions, Psychoeducation, Medication administration, Evaluate responses to treatment, Monitor vital signs and CBGs as ordered, Perform/monitor CIWA, COWS, AIMS and Fall Risk screenings as ordered, Perform wound care treatments as ordered.  Evaluation of Outcomes: Not Met   LCSW Treatment Plan for Primary Diagnosis: Substance induced mood disorder ([place]) Long Term Goal(s): Safe transition to appropriate next level of care at discharge, Engage patient in therapeutic group addressing interpersonal concerns.  Short Term Goals: Engage patient in aftercare planning with referrals and resources, Increase social support, Increase emotional regulation, Facilitate acceptance of mental health diagnosis and concerns, Identify triggers associated with mental health/substance abuse issues, and Increase skills for wellness and recovery  Therapeutic Interventions: Assess for all discharge needs, 1 to 1 time with Social worker, Explore available resources and support systems, Assess for adequacy in community support network, Educate family and significant other(s) on suicide prevention, Complete Psychosocial Assessment, Interpersonal group therapy.  Evaluation of Outcomes: Not  Met   Progress in Treatment: Attending groups: Yes. Participating in groups: Yes. Taking medication as prescribed: Yes. Toleration medication: Yes. Family/Significant other contact made: Yes, individual(s) contacted:  Girlfriend  Patient understands diagnosis: No. Discussing patient identified problems/goals with staff: Yes. Medical problems stabilized or resolved: Yes. Denies suicidal/homicidal ideation: Yes. Issues/concerns per patient self-inventory: No.   New problem(s) identified: No, Describe:  None   New Short Term/Long Term Goal(s): medication stabilization, elimination of SI thoughts, development of comprehensive mental wellness plan.   Patient Goals: "To get a therapist"   Discharge Plan or Barriers: Patient recently admitted. CSW will continue to follow and assess for appropriate referrals and possible discharge planning.   Reason for Continuation of Hospitalization: Anxiety Depression Medication stabilization Suicidal ideation  Estimated Length of Stay: 3 to 7 days   Last [place] Suicide Severity Risk Score: [facility] Admission (Current) from 07/14/2021 in [place] [facility] Most recent reading at 07/14/2021 10:20 PM ED from 07/14/2021 in [facility] Most recent reading at 07/14/2021 12:32 PM ED from 06/23/2021 in [facility] Most recent reading at 06/24/2021 12:44 AM  C-SSRS RISK CATEGORY No Risk No Risk High Risk       Last PHQ 2/9 Scores:     No data to display          Scribe for Treatment Team: Carney Harder 07/15/2021 2:45 PM
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
## 2021-07-15 NOTE — Group Note (Signed)
Date:  07/15/2021 Time:  11:25 AM  Group Topic/Focus:  Orientation:   The focus of this group is to educate the patient on the purpose and policies of crisis stabilization and provide a format to answer questions about their admission.  The group details unit policies and expectations of patients while admitted.    Participation Level:  Minimal  Participation Quality:  Inattentive  Affect:  Blunted  Cognitive:  Appropriate  Insight: Improving  Engagement in Group:  Engaged  Modes of Intervention:  Discussion  Additional Comments:    Reymundo Poll 07/15/2021, 11:25 AM

## 2021-07-15 NOTE — Progress Notes (Addendum)
   Initial Admission Note:  Nathan Escobar is a 36 y.o. male being admitted voluntarily to [facility] on today.  Pt presents with anxiety and depression 5/10 and no pain.  Pt denies SI/HI/AVH, and verbally contracts for safety.  Pt is alert and oriented.  Pt is calm but anxious.    According to previous notes, pt has a history of methamphetamine use, heroin abuse, bipolar disorder, PTSD, substance-induced psychotic disorder and hepatitis C who presented to the [facility] voluntarily on 07/14/2021 via [employer] for suicidal thoughts with a plan to walk into traffic.    Pt is negative for COVID, +THC, BAL<10.  Pt shares he engages in the use of Heroin and Meth.  Pt also reports he smokes 1 1/2 packs of cigarettes per day.  Pt describes his current stressors as: "the anniversary of the lost of his wife who died 1 year ago from a heroin overdose" and " not being with his baby [name] (girlfriend)."  Pt triggers are reported as "when someone is yapping in his ear".  Pt desires help with his "anger" and "depression."  Pt is calm but anxious and dressed in scrubs.    Pt has given consent for admission and treatment.  Pt has been provided information regarding unit rules.  Pt skin search resulted in the following: multiple tattoos; multiple red marks on lower legs/ pt reports r/t playing in the water. No contraband was found.  Pt oriented to the staff and unit.  Pt provided nourishment and fluids for hydration. Pt is presently safe on the unit with Q 15 minute safety checks.

## 2021-07-16 NOTE — Progress Notes (Signed)
Pt affect blunted, mood anxious, rated his day a "10" and his goal was to communicate more with others. Pt currently denies SI/HI or hallucinations (a) 15 min checks (r) safety maintained.

## 2021-07-16 NOTE — [facility] Group Notes (Signed)
[facility] Group Notes:  (Nursing/MHT/Case Management/Adjunct)  Date:  07/16/2021  Time:  11:29 AM  Type of Therapy:  Group Therapy: Goals Group  Participation Level:  Did Not Attend  Summary of Progress/Problems:  Patient did not attend goals group today.   Daneil Dan 07/16/2021, 11:29 AM

## 2021-07-16 NOTE — Progress Notes (Signed)
Patient appears pleasant. Patient denies SI/HI/AVH. Patient complied with morning medication with no reported side effects. Pt reports anxiety and depression a 0/10. Pt reports good sleep and appetite. Patient remains safe on Q15min checks and contracts for safety.      07/16/21 0943  Psych Admission Type (Psych Patients Only)  Admission Status Voluntary  Psychosocial Assessment  Patient Complaints None  Eye Contact Brief  Facial Expression Flat  Affect Flat  Speech Logical/coherent  Interaction Assertive  Motor Activity Slow  Appearance/Hygiene Unremarkable  Behavior Characteristics Cooperative  Mood Depressed;Anxious  Thought Process  Coherency WDL  Content WDL  Delusions WDL  Perception WDL  Hallucination None reported or observed  Judgment Impaired  Confusion None  Danger to Self  Current suicidal ideation? Denies  Self-Injurious Behavior No self-injurious ideation or behavior indicators observed or expressed   Agreement Not to Harm Self Yes  Description of Agreement verbal  Danger to Others  Danger to Others None reported or observed

## 2021-07-16 NOTE — Plan of Care (Signed)
  Problem: Education: Goal: Knowledge of [place] [facility] Education information/materials will improve Outcome: Progressing Goal: Emotional status will improve Outcome: Progressing Goal: Mental status will improve Outcome: Progressing Goal: Verbalization of understanding the information provided will improve Outcome: Progressing   Problem: Activity: Goal: Interest or engagement in activities will improve Outcome: Progressing Goal: Sleeping patterns will improve Outcome: Progressing   Problem: Coping: Goal: Ability to verbalize frustrations and anger appropriately will improve Outcome: Progressing Goal: Ability to demonstrate self-control will improve Outcome: Progressing   Problem: Health Behavior/Discharge Planning: Goal: Identification of resources available to assist in meeting health care needs will improve Outcome: Progressing Goal: Compliance with treatment plan for underlying cause of condition will improve Outcome: Progressing   Problem: Physical Regulation: Goal: Ability to maintain clinical measurements within normal limits will improve Outcome: Progressing   Problem: Safety: Goal: Periods of time without injury will increase Outcome: Progressing   Problem: Education: Goal: Ability to make informed decisions regarding treatment will improve Outcome: Progressing   Problem: Coping: Goal: Coping ability will improve Outcome: Progressing   Problem: Health Behavior/Discharge Planning: Goal: Identification of resources available to assist in meeting health care needs will improve Outcome: Progressing   Problem: Medication: Goal: Compliance with prescribed medication regimen will improve Outcome: Progressing   Problem: Self-Concept: Goal: Ability to disclose and discuss suicidal ideas will improve Outcome: Progressing Goal: Will verbalize positive feelings about self Outcome: Progressing   Problem: Education: Goal: Utilization of techniques to improve  thought processes will improve Outcome: Progressing Goal: Knowledge of the prescribed therapeutic regimen will improve Outcome: Progressing   Problem: Activity: Goal: Interest or engagement in leisure activities will improve Outcome: Progressing Goal: Imbalance in normal sleep/wake cycle will improve Outcome: Progressing   Problem: Coping: Goal: Coping ability will improve Outcome: Progressing Goal: Will verbalize feelings Outcome: Progressing   Problem: Health Behavior/Discharge Planning: Goal: Ability to make decisions will improve Outcome: Progressing Goal: Compliance with therapeutic regimen will improve Outcome: Progressing   Problem: Role Relationship: Goal: Will demonstrate positive changes in social behaviors and relationships Outcome: Progressing   Problem: Safety: Goal: Ability to disclose and discuss suicidal ideas will improve Outcome: Progressing Goal: Ability to identify and utilize support systems that promote safety will improve Outcome: Progressing   Problem: Self-Concept: Goal: Will verbalize positive feelings about self Outcome: Progressing Goal: Level of anxiety will decrease Outcome: Progressing   Problem: Education: Goal: Ability to state activities that reduce stress will improve Outcome: Progressing   Problem: Coping: Goal: Ability to identify and develop effective coping behavior will improve Outcome: Progressing   Problem: Self-Concept: Goal: Ability to identify factors that promote anxiety will improve Outcome: Progressing Goal: Level of anxiety will decrease Outcome: Progressing Goal: Ability to modify response to factors that promote anxiety will improve Outcome: Progressing   

## 2021-07-16 NOTE — Group Note (Signed)
Date:  07/16/2021 Time:  9:09 AM  Group Topic/Focus:  Orientation:   The focus of this group is to educate the patient on the purpose and policies of crisis stabilization and provide a format to answer questions about their admission.  The group details unit policies and expectations of patients while admitted.    Participation Level:  Did Not Attend  Participation Quality:    Affect:   Cognitive:    Insight:   Engagement in Group:    Modes of Intervention:    Additional Comments:  Pt did not attend group after encouraged to do so.  Jaquita Rector 07/16/2021, 9:09 AM

## 2021-07-16 NOTE — [facility] Counselor (Signed)
CSW provided the Pt with a packet that contains information including shelter and housing resources, free and reduced price food information, clothing resources, crisis center information, a [employer] card, and suicide prevention information.   

## 2021-07-16 NOTE — [facility] Counselor (Signed)
CSW spoke with Nathan Escobar at [facility] and confirmed that the Pt has been accepted to the facility on 07/20/2021 to complete his intake assessment at 9:00am.  CSW will inform the Pt and the providers of this information.

## 2021-07-16 NOTE — Progress Notes (Signed)
[facility] MD Progress Note  07/16/2021 6:10 PM Nathan Escobar  MRN:  [id number]  History of Present Illness: This is the first psychiatric admission in this [facility] for the 36 year old male with hx of chronic mental illnesses, substance use disorders (opioid, methamphetamine & THC), multiple psychiatric admissions & outpatient psychiatric services. He is admitted to the [facility] from the [facility] with complain of worsening suicidal ideations with plan to overdose on opioid drug. Chart review reports indicated that patient actually attempted suicide last week. He was complaining of low mood, worsening command auditory hallucinations telling him to harm himself & visual hallucinations (seeing spiders). After Evaluation at the [facility], he was transferred to the [facility] for further psychiatric evaluation/treatments.  24 hour EMR reviewed. Case discussed in progression rounds. Per nurse report, the patient has stated that he feels that he likes it here and wants to stay. He may be without housing?  Subjective:  patient sought out this interviewer to talk this afternoon. He affect was very happy and he stands uncomfortably close. He says that he feels "great" today. He had been noted to be walking up and down the halls much of the day. 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 He states that his brother is his biggest support and he is very excited about this. He denies all depressed mood, thoughts of harm to self or others. Denies hallucinations. He does not even mention to this Probation officer the anniversary of his wife's death or his current housing situation at all. He states that he already has an outpatient doctor at the [facility] and will take his medications.   Principal Problem: Severe bipolar I disorder, most recent episode depressed ([place]) Diagnosis: Principal Problem:   Severe  bipolar I disorder, most recent episode depressed ([place]) Active Problems:   Cannabis use disorder   Antisocial personality disorder ([place])   Nicotine dependence  Total Time spent with patient: 20 minutes  Past Psychiatric History: Bipolar disorder, Schizophrenia. Previously diagnosed with stimulant use disorder, cocaine use, alcohol abuse, opioid abuse, malingering and polysubstance abuse, anxiety, depression, IVDU  Past Medical History:  Past Medical History:  Diagnosis Date   Anxiety    Depression    Hepatitis C    Heroin abuse ([place])    IVDU (intravenous drug user)    Marijuana use    Methamphetamine abuse ([place])    PTSD (post-traumatic stress disorder)    History reviewed. No pertinent surgical history. Family History:  Family History  Problem Relation Age of Onset   Depression Mother    Drug abuse Father    Bipolar disorder Father    Suicidality Paternal Aunt    Bipolar disorder Paternal Aunt    Family Psychiatric  History: Bipolar disorder, Schizophrenia run on bother sides of the family. Social History:  Social History   Substance and Sexual Activity  Alcohol Use None     Social History   Substance and Sexual Activity  Drug Use Yes   Types: Heroin, Methamphetamines, Marijuana    Social History   Socioeconomic History   Marital status: Widowed    Spouse name: Not on file   Number of children: Not on file   Years of education: Not on file   Highest education level: Not on file  Occupational History   Not  on file  Tobacco Use   Smoking status: Every Day    Packs/day: 1.50    Years: 5.00    Total pack years: 7.50    Types: Cigarettes   Smokeless tobacco: Not on file  Substance and Sexual Activity   Alcohol use: Not on file   Drug use: Yes    Types: Heroin, Methamphetamines, Marijuana   Sexual activity: Yes  Other Topics Concern   Not on file  Social History Narrative   Not on file   Social Determinants of Health   Financial Resource Strain: Not  on file  Food Insecurity: Not on file  Transportation Needs: Not on file  Physical Activity: Not on file  Stress: Not on file  Social Connections: Not on file   Additional Social History: see H&P                        Sleep: Fair  Appetite:  Good  Current Medications: Current Facility-Administered Medications  Medication Dose Route Frequency Provider Last Rate Last Admin   acetaminophen (TYLENOL) tablet 650 mg  650 mg Oral Q6H PRN Estella Husk, MD       alum & mag hydroxide-simeth (MAALOX/MYLANTA) 200-200-20 MG/5ML suspension 30 mL  30 mL Oral Q4H PRN Estella Husk, MD       gabapentin (NEURONTIN) capsule 400 mg  400 mg Oral TID Estella Husk, MD   400 mg at 07/16/21 1641   hydrOXYzine (ATARAX) tablet 25 mg  25 mg Oral TID PRN Estella Husk, MD   25 mg at 07/16/21 1158   magnesium hydroxide (MILK OF MAGNESIA) suspension 30 mL  30 mL Oral Daily PRN Estella Husk, MD       nicotine (NICODERM CQ - dosed in mg/24 hours) patch 14 mg  14 mg Transdermal Daily Sindy Guadeloupe, NP   14 mg at 07/16/21 9390   QUEtiapine (SEROQUEL) tablet 100 mg  100 mg Oral QHS Armandina Stammer I, NP   100 mg at 07/15/21 2111   traZODone (DESYREL) tablet 50 mg  50 mg Oral QHS PRN Estella Husk, MD        Lab Results: No results found for this or any previous visit (from the past 48 hour(s)).  Blood Alcohol level:  Lab Results  Component Value Date   ETH <10 07/14/2021   ETH <10 06/23/2021    Metabolic Disorder Labs: Lab Results  Component Value Date   HGBA1C 5.4 06/23/2021   MPG 108.28 06/23/2021   No results found for: "PROLACTIN" Lab Results  Component Value Date   CHOL 157 07/14/2021   TRIG 64 07/14/2021   HDL 68 07/14/2021   CHOLHDL 2.3 07/14/2021   VLDL 13 07/14/2021   LDLCALC 76 07/14/2021   LDLCALC 17 06/23/2021    Physical Findings: AIMS:  , ,  ,  ,    CIWA:    COWS:     Musculoskeletal: Strength & Muscle Tone: within normal  limits Gait & Station: normal Patient leans: N/A  Psychiatric Specialty Exam:  Presentation  General Appearance: Casual  Eye Contact:Fair  Speech:Normal Rate  Speech Volume:Normal  Handedness:Right   Mood and Affect  Mood:Anxious  Affect:-- (bright)   Thought Process  Thought Processes:Linear  Descriptions of Associations:Intact  Orientation:Full (Time, Place and Person)  Thought Content:Logical  History of Schizophrenia/Schizoaffective disorder:No  Duration of Psychotic Symptoms:N/A  Hallucinations:Hallucinations: None  Ideas of Reference:None  Suicidal Thoughts:Suicidal Thoughts: No  Homicidal Thoughts:Homicidal  Thoughts: No   Sensorium  Memory:Immediate Fair; Recent Fair; Remote Fair  Judgment:Poor  Insight:Poor   Executive Functions  Concentration:Fair  Attention Span:Poor  Recall:Fair  Fund of Knowledge:Fair  Language:Fair   Psychomotor Activity  Psychomotor Activity:Psychomotor Activity: Restlessness   Assets  Assets:Leisure Time   Sleep  Sleep:Sleep: Fair Number of Hours of Sleep: 7.5    Physical Exam: Physical Exam ROS Blood pressure 129/83, pulse (!) 162, temperature 98.2 F (36.8 C), temperature source Oral, resp. rate 16, height 5\' 3"  (1.6 m), weight 65.3 kg, SpO2 99 %. Body mass index is 25.51 kg/m.   Treatment Plan Summary: Daily contact with patient to assess and evaluate symptoms and progress in treatment and Medication management  Medications: Mood/anxiety: continue group therapy, milieu therapy, 1:1 evaluation with provider.  Seroquel 100mg  PO QHS for bipolar disorder, sleep Trazodone PRN sleep Hydoxyzine 25mg  PO TID PRN anxiety Substance Abuse: brief intervention provided abstinence advised. Monitoring for withdrawal SW has contacted the [facility] and they have offered and bed if patient will accept, but it appears that he may decline this Gabapentin 400mg  PO TID Nicotine patch for  nicotine replacement. Cessation counseling provided.  Antisocial personality Personality disorder: limit or eliminate controlled medications. Firm and consistent boundaries.  Given rapid recovery with improvement in outside factors, there is a very high suspicion for malingering Medical: PRNs for pain, constipation, indigestion available.  Safety and Monitoring: In/voluntary admission to inpatient psychiatric unit for safety, stabilization and treatment Daily contact with patient to assess and evaluate symptoms and progress in treatment Patient's case to be discussed in multi-disciplinary team meeting Observation Level : q15 minute checks Vital signs: q12 hours Precautions: suicide, withdrawal  Dispo... home v. Rehab. Discuss with team and then with pt tomorrow.    , MD 07/16/2021, 6:10 PM

## 2021-07-16 NOTE — [facility] Group Notes (Signed)
[facility] Group Notes:  (Nursing/MHT/Case Management/Adjunct)  Date:  07/16/2021  Time:  11:51 AM  Type of Therapy:  Group Therapy:   Participation Level:  Did Not Attend   Summary of Progress/Problems:   Patient did not attend reflection (check-in) group today.   Daneil Dan 07/16/2021, 11:51 AM

## 2021-07-17 DIAGNOSIS — F314 Bipolar disorder, current episode depressed, severe, without psychotic features: Principal | ICD-10-CM

## 2021-07-17 MED ORDER — TRAZODONE HCL 50 MG PO TABS: 50.0000 mg | ORAL_TABLET | Freq: Every evening | ORAL | 0 refills | Status: DC | PRN

## 2021-07-17 MED ORDER — QUETIAPINE FUMARATE 100 MG PO TABS: 100.0000 mg | ORAL_TABLET | Freq: Every day | ORAL | 0 refills | Status: DC

## 2021-07-17 MED ORDER — NICOTINE 14 MG/24HR TD PT24: 14.0000 mg | MEDICATED_PATCH | Freq: Every day | TRANSDERMAL | 0 refills | Status: DC

## 2021-07-17 MED ORDER — HYDROXYZINE HCL 25 MG PO TABS: 25.0000 mg | ORAL_TABLET | Freq: Three times a day (TID) | ORAL | 0 refills | Status: DC | PRN

## 2021-07-17 MED ORDER — GABAPENTIN 400 MG PO CAPS: 400.0000 mg | ORAL_CAPSULE | Freq: Three times a day (TID) | ORAL | 0 refills | Status: DC

## 2021-07-17 NOTE — B[facility] Group Notes (Signed)
[facility] Group Notes:  (Nursing/MHT/Case Management/Adjunct)  Date:  07/17/2021  Time:  9:38 AM  Type of Therapy:  Group Therapy  Participation Level:  Active  Participation Quality:  Appropriate  Affect:  Appropriate  Cognitive:  Appropriate  Insight:  Appropriate  Engagement in Group:  Engaged  Modes of Intervention:  Discussion  Summary of Progress/Problems:  Patient attended and participated in an orientation and goals group today. Patient's goal for today is to stay on his medication when he goes home today.   Daneil Dan 07/17/2021, 9:38 AM

## 2021-07-17 NOTE — Progress Notes (Addendum)
Patient appears pleasant. Patient denies SI/HI/AVH. Patient complied with morning medication with no reported side effects. Pt is very happy about discharge. Pt states that he is ready and excited. Pt was given a vistaril to help his anxiety. Patient remains safe on Q31min checks and contracts for safety.      07/17/21 0900  Psych Admission Type (Psych Patients Only)  Admission Status Voluntary  Psychosocial Assessment  Patient Complaints None  Eye Contact Brief  Facial Expression Flat  Affect Flat  Speech Logical/coherent  Interaction Minimal  Motor Activity Slow  Appearance/Hygiene Disheveled  Behavior Characteristics Cooperative  Mood Anxious;Depressed  Thought Process  Coherency WDL  Content WDL  Delusions None reported or observed  Perception WDL  Hallucination None reported or observed  Judgment Impaired  Confusion None  Danger to Self  Current suicidal ideation? Denies  Self-Injurious Behavior No self-injurious ideation or behavior indicators observed or expressed   Agreement Not to Harm Self Yes  Description of Agreement verbal  Danger to Others  Danger to Others None reported or observed

## 2021-07-17 NOTE — Plan of Care (Signed)
°  Problem: Education: °Goal: Knowledge of [facility] Education information/materials will improve °Outcome: Adequate for Discharge °Goal: Emotional status will improve °Outcome: Adequate for Discharge °Goal: Mental status will improve °Outcome: Adequate for Discharge °Goal: Verbalization of understanding the information provided will improve °Outcome: Adequate for Discharge °  °Problem: Activity: °Goal: Interest or engagement in activities will improve °Outcome: Adequate for Discharge °Goal: Sleeping patterns will improve °Outcome: Adequate for Discharge °  °Problem: Coping: °Goal: Ability to verbalize frustrations and anger appropriately will improve °Outcome: Adequate for Discharge °Goal: Ability to demonstrate self-control will improve °Outcome: Adequate for Discharge °  °Problem: Health Behavior/Discharge Planning: °Goal: Identification of resources available to assist in meeting health care needs will improve °Outcome: Adequate for Discharge °Goal: Compliance with treatment plan for underlying cause of condition will improve °Outcome: Adequate for Discharge °  °Problem: Physical Regulation: °Goal: Ability to maintain clinical measurements within normal limits will improve °Outcome: Adequate for Discharge °  °Problem: Safety: °Goal: Periods of time without injury will increase °Outcome: Adequate for Discharge °  °Problem: Education: °Goal: Ability to make informed decisions regarding treatment will improve °Outcome: Adequate for Discharge °  °Problem: Coping: °Goal: Coping ability will improve °Outcome: Adequate for Discharge °  °Problem: Health Behavior/Discharge Planning: °Goal: Identification of resources available to assist in meeting health care needs will improve °Outcome: Adequate for Discharge °  °Problem: Medication: °Goal: Compliance with prescribed medication regimen will improve °Outcome: Adequate for Discharge °  °Problem: Self-Concept: °Goal: Ability to disclose and discuss suicidal ideas  will improve °Outcome: Adequate for Discharge °Goal: Will verbalize positive feelings about self °Outcome: Adequate for Discharge °  °Problem: Education: °Goal: Utilization of techniques to improve thought processes will improve °Outcome: Adequate for Discharge °Goal: Knowledge of the prescribed therapeutic regimen will improve °Outcome: Adequate for Discharge °  °Problem: Activity: °Goal: Interest or engagement in leisure activities will improve °Outcome: Adequate for Discharge °Goal: Imbalance in normal sleep/wake cycle will improve °Outcome: Adequate for Discharge °  °Problem: Coping: °Goal: Coping ability will improve °Outcome: Adequate for Discharge °Goal: Will verbalize feelings °Outcome: Adequate for Discharge °  °Problem: Health Behavior/Discharge Planning: °Goal: Ability to make decisions will improve °Outcome: Adequate for Discharge °Goal: Compliance with therapeutic regimen will improve °Outcome: Adequate for Discharge °  °Problem: Role Relationship: °Goal: Will demonstrate positive changes in social behaviors and relationships °Outcome: Adequate for Discharge °  °Problem: Safety: °Goal: Ability to disclose and discuss suicidal ideas will improve °Outcome: Adequate for Discharge °Goal: Ability to identify and utilize support systems that promote safety will improve °Outcome: Adequate for Discharge °  °Problem: Self-Concept: °Goal: Will verbalize positive feelings about self °Outcome: Adequate for Discharge °Goal: Level of anxiety will decrease °Outcome: Adequate for Discharge °  °Problem: Education: °Goal: Ability to state activities that reduce stress will improve °Outcome: Adequate for Discharge °  °Problem: Coping: °Goal: Ability to identify and develop effective coping behavior will improve °Outcome: Adequate for Discharge °  °Problem: Self-Concept: °Goal: Ability to identify factors that promote anxiety will improve °Outcome: Adequate for Discharge °Goal: Level of anxiety will decrease °Outcome:  Adequate for Discharge °Goal: Ability to modify response to factors that promote anxiety will improve °Outcome: Adequate for Discharge °  °

## 2021-07-17 NOTE — Progress Notes (Signed)
Pt was educated about discharge. Pt was satisfied all belongings have been returned. Pt was given and reviewed discharge papers. Pt was discharged to the lobby.
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
## 2021-07-17 NOTE — Discharge Summary (Incomplete)
Physician Discharge Summary Note  Patient:  Nathan Escobar is an 36 y.o., male MRN:  [id number] DOB:  [birth year] Patient phone:  [phone] (home)  Patient address:   [address] [place] [address],  Total Time spent with patient: {Time; 15 min - 8 hours:17441}  Date of Admission:  07/14/2021 Date of Discharge: ***  Reason for Admission:  ***  Principal Problem: Severe bipolar I disorder, most recent episode depressed [facility]) Discharge Diagnoses: Principal Problem:   Severe bipolar I disorder, most recent episode depressed ([place]) Active Problems:   Cannabis use disorder   Antisocial personality disorder ([place])   Nicotine dependence   Past Psychiatric History: ***  Past Medical History:  Past Medical History:  Diagnosis Date   Anxiety    Depression    Hepatitis C    Heroin abuse ([place])    IVDU (intravenous drug user)    Marijuana use    Methamphetamine abuse ([place])    PTSD (post-traumatic stress disorder)    History reviewed. No pertinent surgical history. Family History:  Family History  Problem Relation Age of Onset   Depression Mother    Drug abuse Father    Bipolar disorder Father    Suicidality Paternal Aunt    Bipolar disorder Paternal Aunt    Family Psychiatric  History: *** Social History:  Social History   Substance and Sexual Activity  Alcohol Use None     Social History   Substance and Sexual Activity  Drug Use Yes   Types: Heroin, Methamphetamines, Marijuana    Social History   Socioeconomic History   Marital status: Widowed    Spouse name: Not on file   Number of children: Not on file   Years of education: Not on file   Highest education level: Not on file  Occupational History   Not on file  Tobacco Use   Smoking status: Every Day    Packs/day: 1.50    Years: 5.00    Total pack years: 7.50    Types: Cigarettes   Smokeless tobacco: Not on file  Substance and Sexual Activity   Alcohol use: Not on file   Drug use: Yes     Types: Heroin, Methamphetamines, Marijuana   Sexual activity: Yes  Other Topics Concern   Not on file  Social History Narrative   Not on file   Social Determinants of Health   Financial Resource Strain: Not on file  Food Insecurity: Not on file  Transportation Needs: Not on file  Physical Activity: Not on file  Stress: Not on file  Social Connections: Not on file    Hospital Course:  ***  Physical Findings: AIMS: Facial and Oral Movements Muscles of Facial Expression: None, normal Lips and Perioral Area: None, normal Jaw: None, normal Tongue: None, normal,Extremity Movements Upper (arms, wrists, hands, fingers): None, normal Lower (legs, knees, ankles, toes): None, normal, Trunk Movements Neck, shoulders, hips: None, normal, Overall Severity Severity of abnormal movements (highest score from questions above): None, normal Incapacitation due to abnormal movements: None, normal Patient's awareness of abnormal movements (rate only patient's report): No Awareness, Dental Status Current problems with teeth and/or dentures?: No Does patient usually wear dentures?: No  CIWA:    COWS:     Musculoskeletal: Strength & Muscle Tone: {desc; muscle tone:32375} Gait & Station: {PE GAIT ED EF:6704556 Patient leans: {Patient Leans:21022755}   Psychiatric Specialty Exam:  Presentation  General Appearance: Casual  Eye Contact:Fair  Speech:Normal Rate  Speech Volume:Normal  Handedness:Right  Mood and Affect  Mood:Anxious  Affect:-- (bright)   Thought Process  Thought Processes:Linear  Descriptions of Associations:Intact  Orientation:Full (Time, Place and Person)  Thought Content:Logical  History of Schizophrenia/Schizoaffective disorder:No  Duration of Psychotic Symptoms:N/A  Hallucinations:Hallucinations: None  Ideas of Reference:None  Suicidal Thoughts:Suicidal Thoughts: No  Homicidal Thoughts:Homicidal Thoughts: No   Sensorium   Memory:Immediate Fair; Recent Fair; Remote Fair  Judgment:Poor  Insight:Poor   Executive Functions  Concentration:Fair  Attention Span:Poor  Recall:Fair  Fund of Knowledge:Fair  Language:Fair   Psychomotor Activity  Psychomotor Activity:Psychomotor Activity: Restlessness   Assets  Assets:Leisure Time   Sleep  Sleep:Sleep: Fair Number of Hours of Sleep: 7.5    Physical Exam: Physical Exam ROS Blood pressure 121/85, pulse 73, temperature (!) 97.3 F (36.3 C), temperature source Oral, resp. rate 16, height 5\' 3"  (1.6 m), weight 65.3 kg, SpO2 98 %. Body mass index is 25.51 kg/m.   Social History   Tobacco Use  Smoking Status Every Day   Packs/day: 1.50   Years: 5.00   Total pack years: 7.50   Types: Cigarettes  Smokeless Tobacco Not on file   Tobacco Cessation:  {Discharge tobacco cessation prescription:304700209}   Blood Alcohol level:  Lab Results  Component Value Date   ETH <10 07/14/2021   ETH <10 06/23/2021    Metabolic Disorder Labs:  Lab Results  Component Value Date   HGBA1C 5.4 06/23/2021   MPG 108.28 06/23/2021   No results found for: "PROLACTIN" Lab Results  Component Value Date   CHOL 157 07/14/2021   TRIG 64 07/14/2021   HDL 68 07/14/2021   CHOLHDL 2.3 07/14/2021   VLDL 13 07/14/2021   LDLCALC 76 07/14/2021   LDLCALC 17 06/23/2021    See Psychiatric Specialty Exam and Suicide Risk Assessment completed by Attending Physician prior to discharge.  Discharge destination:  {DISCHARGE DESTINATION:22616}  Is patient on multiple antipsychotic therapies at discharge:  {RECOMMEND TAPERING:22617}   Has Patient had three or more failed trials of antipsychotic monotherapy by history:  {BHH ANTIPSYCHOTIC:22903}  Recommended Plan for Multiple Antipsychotic Therapies: {BHH MULTIPLE ANTIPSYCHOTIC THERAPIES:22905}  Discharge Instructions     Diet - low sodium heart healthy   Complete by: As directed    Discharge instructions    Complete by: As directed    Prescriptions for new medications provided for the patient to bridge to follow up appointment. The patient was informed that refills for these prescriptions are generally not provided, and patient is encouraged to attend all follow up appointments to address medication refills and adjustments.   Today's discharge was reviewed with treatment team, and the team is in agreement that the patient is ready for discharge. The patient is was of the discharge plan for today and has been given opportunity to ask questions. At time of discharge, the patient does not vocalize any acute harm to self or others, is goal directed, able to advocate for self and organizational baseline.   At discharge, the patient is instructed to:  Take all medications as prescribed. Report any adverse effects and or reactions from the medicines to her outpatient provider promptly.  Do not engage in alcohol and/or illegal drug use while on prescription medicines.  In the event of worsening symptoms, patient is instructed to call the crisis hotline, 911 and or go to the nearest ED for appropriate evaluation and treatment of symptoms.  Follow-up with primary care provider for further care of medical issues, concerns and or health care needs. * Substance abuse follow  up: it is recommended that you follow up with community support treatment, like AA/NA. It is also recommended that the patient attend 90 meetings in 90 days, otherwise known as "90 in 90"   Increase activity slowly   Complete by: As directed       Allergies as of 07/17/2021       Reactions   Fish Allergy Anaphylaxis   Shellfish Allergy Anaphylaxis   Tramadol Hives   Ketorolac Nausea And Vomiting, Hives        Medication List     TAKE these medications      Indication  gabapentin 400 MG capsule Commonly known as: NEURONTIN Take 1 capsule (400 mg total) by mouth 3 (three) times daily.  Indication: Abuse or Misuse of Alcohol    hydrOXYzine 25 MG tablet Commonly known as: ATARAX Take 1 tablet (25 mg total) by mouth 3 (three) times daily as needed for anxiety.  Indication: Feeling Anxious   nicotine 14 mg/24hr patch Commonly known as: NICODERM CQ - dosed in mg/24 hours Place 1 patch (14 mg total) onto the skin daily. Start taking on: July 18, 2021  Indication: Nicotine Addiction   QUEtiapine 100 MG tablet Commonly known as: SEROQUEL Take 1 tablet (100 mg total) by mouth at bedtime. What changed:  medication strength how much to take  Indication: Manic-Depression   traZODone 50 MG tablet Commonly known as: DESYREL Take 1 tablet (50 mg total) by mouth at bedtime as needed for sleep.  Indication: Trouble Sleeping        Follow-up Information     Services[facility] Follow up on 07/20/2021.   Why: You have been accepted to this facility and will need to complete your intake assessment on 07/20/2021 at 7:00am Contact information: [address] [place] [address] [phone]         [facility] Follow up on 08/19/2021.   Specialty: Behavioral Health Why: You have an appointment to obtain therapy and medication management services on 07/20/21 at 7:30 am.  Services are provided on a first come, first served basis and are held in person.  You may use walk-in hours on Mondays and Wednesdays prior to this date. Contact information: [address] [place] [address] [phone]                Follow-up recommendations:  {BHH DC FU RECOMMENDATIONS:22620}  Comments:  ***  Signed: Roselle Locus, MD 07/17/2021, 10:23 AM

## 2021-07-17 NOTE — BHH Group Notes (Signed)
BHH Group Notes:  (Nursing/MHT/Case Management/Adjunct)  Date:  07/17/2021  Time:  11:58 AM  Type of Therapy:  Psychoeducational Skills   Participation Level:  Active   Participation Quality:  Appropriate   Affect:  Appropriate   Cognitive:  Appropriate   Insight:  Appropriate   Engagement in Group:  Engaged   Modes of Intervention:  Discussion   Summary of Progress/Problems:   Patient attended and participated in a psycho-educational group involving self care. Patient's were asked to three questions involving self care.    What is a self care routine that you are already doing? What is a self care routine that you have recently picked up? What is a self care routine that you would like to start doing?     Playing soccer Not using  Continue to not use  Nathan Escobar 07/17/2021, 11:58 AM

## 2021-07-17 NOTE — Group Note (Signed)
Recreation Therapy Group Note   Group Topic:Stress Management  Group Date: 07/17/2021 Start Time: 0930 End Time: 0950 Facilitators: Caroll Rancher, LRT,CTRS Location: [address] Dayroom   Goal Area(s) Addresses:  Patient will actively participate in stress management techniques presented during session.  Patient will successfully identify benefit of practicing stress management post d/c.   Group Description:  Guided Imagery. LRT provided education, instruction, and demonstration on practice of visualization via guided imagery. Patient was asked to participate in the technique introduced during session. LRT debriefed including topics of mindfulness, stress management and specific scenarios each patient could use these techniques. Patients were given suggestions of ways to access scripts post d/c and encouraged to explore Youtube and other apps available on smartphones, tablets, and computers.   Affect/Mood: N/A   Participation Level: Did not attend    Clinical Observations/Individualized Feedback:     Plan: Continue to engage patient in RT group sessions 2-3x/week.   Caroll Rancher, Antonietta Jewel 07/17/2021 12:43 PM

## 2021-07-17 NOTE — Progress Notes (Signed)
Pt observed sleeping in bed. Pt was asked to rate depression and anxiety and pt states "Im good". Pt reports a good appetite, and no physical problems. Pt denies SI/HI/AVH and verbally contracts for safety. Provided support and encouragement. Pt safe on the unit. Q 15 minute safety checks continued.

## 2021-07-17 NOTE — BHH Counselor (Signed)
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  He states that he would like to be discharged to go to work and no longer wishes to attend [facility] Treatment on Monday 07/20/2021.  CSW informed the Pt that she would leave this information in his follow-up in case he decides over the weekend that he would like to go.  Pt will be discharged today 07/17/2021 with a referral to [facility] and information to begin outpatient services with [facility].

## 2021-07-17 NOTE — BHH Suicide Risk Assessment (Signed)
BHH INPATIENT:  Family/Significant Other Suicide Prevention Education  Suicide Prevention Education:  Contact Attempts: Richy Spradley [phone] (Girlfriend) has been identified by the patient as the family member/significant other with whom the patient will be residing, and identified as the person(s) who will aid the patient in the event of a mental health crisis.  With written consent from the patient, two attempts were made to provide suicide prevention education, prior to and/or following the patient's discharge.  We were unsuccessful in providing suicide prevention education.  A suicide education pamphlet was given to the patient to share with family/significant other.  Date and time of first attempt: 07/16/2021 at 1:13pm Date and time of second attempt: 07/17/2021 at 10:48am   Aram Beecham 07/17/2021, 10:47 AM

## 2021-07-17 NOTE — Progress Notes (Signed)
  [facility] Adult Case Management Discharge Plan :  Will you be returning to the same living situation after discharge:  Yes,  Home  At discharge, do you have transportation home?: Yes,  Taxi  Do you have the ability to pay for your medications: Yes,  Medicaid   Release of information consent forms completed and in the chart;  Patient's signature needed at discharge.  Patient to Follow up at:  Follow-up Information     [facility] Follow up on 07/20/2021.   Why: You have been accepted to this facility and will need to complete your intake assessment on 07/20/2021 at 7:00am Contact information: [address] [place] [address] [phone]         [facility] Follow up on 08/19/2021.   Specialty: Behavioral Health Why: You have an appointment to obtain therapy and medication management services on 07/20/21 at 7:30 am.  Services are provided on a first come, first served basis and are held in person.  You may use walk-in hours on Mondays and Wednesdays prior to this date. Contact information: [address] [place] [address] [phone]                Next level of care provider has access to [facility] Link:yes  Safety Planning and Suicide Prevention discussed: Yes,  With Patient      Has patient been referred to the Quitline?: Patient refused referral  Patient has been referred for addiction treatment: Yes If patient is scheduled for Residential Treatment if he chooses to go.  If not he may attend [facility] for therapy and medication management services.   Aram Beecham, LCSWA 07/17/2021, 9:51 AM

## 2021-08-10 ENCOUNTER — Telehealth (HOSPITAL_COMMUNITY): Payer: Self-pay | Admitting: General Practice

## 2021-08-10 NOTE — BH Assessment (Signed)
Care Management - Follow Up [facility] Discharges   Patient has been placed in an inpatient psychiatric hospital ([facility]) on 07-14-2021 

## 2021-11-07 ENCOUNTER — Ambulatory Visit (HOSPITAL_COMMUNITY)
Admission: EM | Admit: 2021-11-07 | Discharge: 2021-11-07 | Disposition: A | Discharge status: Home / Self Care | Payer: Medicaid Other | Attending: Family | Admitting: Family

## 2021-11-07 ENCOUNTER — Ambulatory Visit (HOSPITAL_COMMUNITY)
Admission: RE | Admit: 2021-11-07 | Discharge: 2021-11-07 | Disposition: A | Discharge status: Home / Self Care | Payer: Medicaid Other | Attending: Psychiatry | Admitting: Psychiatry

## 2021-11-07 DIAGNOSIS — G4700 Insomnia, unspecified: Secondary | ICD-10-CM | POA: Insufficient documentation

## 2021-11-07 DIAGNOSIS — F314 Bipolar disorder, current episode depressed, severe, without psychotic features: Secondary | ICD-10-CM | POA: Diagnosis not present

## 2021-11-07 DIAGNOSIS — F1910 Other psychoactive substance abuse, uncomplicated: Secondary | ICD-10-CM | POA: Insufficient documentation

## 2021-11-07 DIAGNOSIS — F32A Depression, unspecified: Secondary | ICD-10-CM | POA: Diagnosis not present

## 2021-11-07 DIAGNOSIS — R440 Auditory hallucinations: Secondary | ICD-10-CM | POA: Insufficient documentation

## 2021-11-07 DIAGNOSIS — R45851 Suicidal ideations: Secondary | ICD-10-CM | POA: Diagnosis present

## 2021-11-07 DIAGNOSIS — Z1152 Encounter for screening for COVID-19: Secondary | ICD-10-CM | POA: Insufficient documentation

## 2021-11-07 LAB — SARS CORONAVIRUS 2 BY RT PCR: SARS Coronavirus 2 by RT PCR: NEGATIVE

## 2021-11-07 LAB — POC SARS CORONAVIRUS 2 AG: SARSCOV2ONAVIRUS 2 AG: NEGATIVE

## 2021-11-07 MED ORDER — QUETIAPINE FUMARATE 50 MG PO TABS: 50.0000 mg | ORAL_TABLET | Freq: Every day | ORAL | Status: DC

## 2021-11-07 MED ORDER — ACETAMINOPHEN 325 MG PO TABS: 650.0000 mg | ORAL_TABLET | Freq: Four times a day (QID) | ORAL | Status: DC | PRN

## 2021-11-07 MED ORDER — MAGNESIUM HYDROXIDE 400 MG/5ML PO SUSP: 30.0000 mL | Freq: Every day | ORAL | Status: DC | PRN

## 2021-11-07 MED ORDER — ALUM & MAG HYDROXIDE-SIMETH 200-200-20 MG/5ML PO SUSP: 30.0000 mL | ORAL | Status: DC | PRN

## 2021-11-07 MED ORDER — TRAZODONE HCL 50 MG PO TABS: 50.0000 mg | ORAL_TABLET | Freq: Every evening | ORAL | Status: DC | PRN

## 2021-11-07 MED ORDER — GABAPENTIN 100 MG PO CAPS: 100.0000 mg | ORAL_CAPSULE | Freq: Two times a day (BID) | ORAL | Status: DC

## 2021-11-07 NOTE — ED Provider Notes (Cosign Needed Addendum)
[facility] Continuous Assessment Admission H&P  Date: 11/07/21 Patient Name: Nathan Escobar MRN: [id number] Chief Complaint:   Diagnoses:  Final diagnoses:  Severe bipolar I disorder, most recent episode depressed [facility])    HPI: Nathan Escobar 36 year old Caucasian male initially presented to [facility] behavioral health citing suicidal ideations with plan to overdose on heroin.  Patient was transferred to [facility] urgent care facility.  Upon reassessment patient is requesting to be restarted on medications and needing additional outpatient resources for therapy and psychiatry. -Patient is unable to recall medications he was prescribed in the past.  States has been suicidal for the past month.  Patient is requesting to be transferred to [facility] for his substance abuse history.  Audiel reports upcoming court date 11/09/2021 states his probation officer requested that he follow-up with his mental health prior to his court date. NP will restart gabapentin 100 mg p.o. twice daily and Seroquel 50 mg p.o. nightly.  CBC , CMP, TSH and UDS pending results  Per initial assessment note: " Nathan Escobar is a 36 y.o. Caucasian male who presents voluntarily as a walk-in to [facility] for worsening depressive symptoms, suicidal ideation, and auditory hallucinations.  Patient has past psychiatric diagnoses of amphetamine use disorder severe, amphetamine induced depressive disorder, antisocial personality disorder, cannabis use disorder, cocaine use, history of alcohol abuse, malingering, nicotine dependence, polysubstance abuse, posttraumatic stress, severe bipolar 1 disorder most recent episode depressed, severe opioid use disorder, and stimulant use disorder.  This is the third psychiatric admission in this [facility] for this 36 year old male.  Patient has multiple ED visits about 7 times this year."   PHQ 2-9:   [place] OP Visit from 11/07/2021 in [facility] Most recent reading at 11/07/2021 12:59 PM Admission (Discharged) from 07/14/2021 in [place] [facility] Most recent reading at 07/14/2021 10:20 PM ED from 07/14/2021 in [facility] Most recent reading at 07/14/2021 12:32 PM  C-SSRS RISK CATEGORY Moderate Risk No Risk No Risk        Total Time spent with patient: 15 minutes  Musculoskeletal  Strength & Muscle Tone: within normal limits Gait & Station: normal Patient leans: N/A  Psychiatric Specialty Exam  Presentation General Appearance:  Disheveled  Eye Contact: Fair  Speech: Clear and Coherent; Normal Rate  Speech Volume: Normal  Handedness: Right   Mood and Affect  Mood: Anxious; Dysphoric  Affect: Congruent   Thought Process  Thought Processes: Linear  Descriptions of Associations:Intact  Orientation:Full (Time, Place and Person)  Thought Content:Logical  Diagnosis of Schizophrenia or Schizoaffective disorder in past: No   Hallucinations:Hallucinations: Auditory Description of Auditory Hallucinations: Hearing people call his name  Ideas of Reference:None  Suicidal Thoughts:Suicidal Thoughts: Yes, Passive SI Passive Intent and/or Plan: Without Plan  Homicidal Thoughts:Homicidal Thoughts: No   Sensorium  Memory: Immediate Fair; Remote Fair; Recent Fair  Judgment: Poor  Insight: Poor   Executive Functions  Concentration: Fair  Attention Span: Fair  Recall: [employer] of Knowledge: Fair  Language: Fair   Psychomotor Activity  Psychomotor Activity: Psychomotor Activity: Restlessness; Increased   Assets  Assets: Communication Skills; Physical Health   Sleep  Sleep: Sleep: Poor Number of Hours of Sleep: 0.5   No data recorded  Physical Exam Vitals and nursing note reviewed.  Cardiovascular:     Rate and Rhythm: Normal rate and regular rhythm.  Pulmonary:     Effort: Pulmonary effort is normal.  Breath sounds: Normal breath sounds.  Neurological:     Mental Status: He is alert and oriented to person, place, and time.  Psychiatric:        Mood and Affect: Mood normal.        Behavior: Behavior normal.    Review of Systems  Gastrointestinal: Negative.   Genitourinary: Negative.   Skin: Negative.   Endo/Heme/Allergies: Negative.   Psychiatric/Behavioral:  Positive for depression, substance abuse and suicidal ideas. The patient is nervous/anxious.   All other systems reviewed and are negative.   Blood pressure 109/85, pulse 79, temperature 98 F (36.7 C), temperature source Oral, resp. rate 18, SpO2 100 %. There is no height or weight on file to calculate BMI.  Past Psychiatric History:   Is the patient at risk to self? Yes  Has the patient been a risk to self in the past 6 months? Yes .    Has the patient been a risk to self within the distant past? No   Is the patient a risk to others? No   Has the patient been a risk to others in the past 6 months? Yes   Has the patient been a risk to others within the distant past? Yes   Past Medical History:  Past Medical History:  Diagnosis Date   Anxiety    Depression    Hepatitis C    Heroin abuse ([place])    IVDU (intravenous drug user)    Marijuana use    Methamphetamine abuse ([place])    PTSD (post-traumatic stress disorder)    No past surgical history on file.  Family History:  Family History  Problem Relation Age of Onset   Depression Mother    Drug abuse Father    Bipolar disorder Father    Suicidality Paternal Aunt    Bipolar disorder Paternal Aunt     Social History:  Social History   Socioeconomic History   Marital status: Widowed    Spouse name: Not on file   Number of children: Not on file   Years of education: Not on file   Highest education level: Not on file  Occupational History   Not on file  Tobacco Use   Smoking status: Every Day    Packs/day: 1.50    Years: 5.00    Total pack years: 7.50     Types: Cigarettes   Smokeless tobacco: Not on file  Substance and Sexual Activity   Alcohol use: Not on file   Drug use: Yes    Types: Heroin, Methamphetamines, Marijuana   Sexual activity: Yes  Other Topics Concern   Not on file  Social History Narrative   Not on file   Social Determinants of Health   Financial Resource Strain: Not on file  Food Insecurity: Not on file  Transportation Needs: Not on file  Physical Activity: Not on file  Stress: Not on file  Social Connections: Not on file  Intimate Partner Violence: Not on file    SDOH:  SDOH Screenings   Alcohol Screen: Low Risk  (07/14/2021)  Tobacco Use: High Risk (07/15/2021)    Last Labs:  Admission on 11/07/2021  Component Date Value Ref Range Status   SARSCOV2ONAVIRUS 2 AG 11/07/2021 NEGATIVE  NEGATIVE Final   Comment: (NOTE) SARS-CoV-2 antigen NOT DETECTED.   Negative results are presumptive.  Negative results do not preclude SARS-CoV-2 infection and should not be used as the sole basis for treatment or other patient management decisions, including infection  control  decisions, particularly in the presence of clinical signs and  symptoms consistent with COVID-19, or in those who have been in contact with the virus.  Negative results must be combined with clinical observations, patient history, and epidemiological information. The expected result is Negative.  Fact Sheet for Patients: https://www.jennings-kim.com/https://www.fda.gov/media/141569/download  Fact Sheet for Healthcare Providers: https://alexander-rogers.biz/https://www.fda.gov/media/141568/download  This test is not yet approved or cleared by the [place] FDA and  has been authorized for detection and/or diagnosis of SARS-CoV-2 by FDA under an Emergency Use Authorization (EUA).  This EUA will remain in effect (meaning this test can be used) for the duration of  the COV                          ID-19 declaration under Section 564(b)(1) of the Act, 21 U.S.C. section 360bbb-3(b)(1), unless the  authorization is terminated or revoked sooner.    Hospital Outpatient Visit on 11/07/2021  Component Date Value Ref Range Status   SARS Coronavirus 2 by RT PCR 11/07/2021 NEGATIVE  NEGATIVE Final   Comment: (NOTE) SARS-CoV-2 target nucleic acids are NOT DETECTED.  The SARS-CoV-2 RNA is generally detectable in upper and lower respiratory specimens during the acute phase of infection. The lowest concentration of SARS-CoV-2 viral copies this assay can detect is 250 copies / mL. A negative result does not preclude SARS-CoV-2 infection and should not be used as the sole basis for treatment or other patient management decisions.  A negative result may occur with improper specimen collection / handling, submission of specimen other than nasopharyngeal swab, presence of viral mutation(s) within the areas targeted by this assay, and inadequate number of viral copies (<250 copies / mL). A negative result must be combined with clinical observations, patient history, and epidemiological information.  Fact Sheet for Patients:   RoadLapTop.co.zahttps://www.fda.gov/media/158405/download  Fact Sheet for Healthcare Providers: http://kim-miller.com/https://www.fda.gov/media/158404/download  This test is not yet approved or                           cleared by the [place] FDA and has been authorized for detection and/or diagnosis of SARS-CoV-2 by FDA under an Emergency Use Authorization (EUA).  This EUA will remain in effect (meaning this test can be used) for the duration of the COVID-19 declaration under Section 564(b)(1) of the Act, 21 U.S.C. section 360bbb-3(b)(1), unless the authorization is terminated or revoked sooner.  Performed at [facility], [address] [place], [place] [id number]   Admission on 07/14/2021, Discharged on 07/14/2021  Component Date Value Ref Range Status   SARS Coronavirus 2 by RT PCR 07/14/2021 NEGATIVE  NEGATIVE Final   Comment: (NOTE) SARS-CoV-2 target nucleic acids are  NOT DETECTED.  The SARS-CoV-2 RNA is generally detectable in upper respiratory specimens during the acute phase of infection. The lowest concentration of SARS-CoV-2 viral copies this assay can detect is 138 copies/mL. A negative result does not preclude SARS-Cov-2 infection and should not be used as the sole basis for treatment or other patient management decisions. A negative result may occur with  improper specimen collection/handling, submission of specimen other than nasopharyngeal swab, presence of viral mutation(s) within the areas targeted by this assay, and inadequate number of viral copies(<138 copies/mL). A negative result must be combined with clinical observations, patient history, and epidemiological information. The expected result is Negative.  Fact Sheet for Patients:  BloggerCourse.comhttps://www.fda.gov/media/152166/download  Fact Sheet for Healthcare Providers:  SeriousBroker.ithttps://www.fda.gov/media/152162/download  This test  is no                          t yet approved or cleared by the [place] and  has been authorized for detection and/or diagnosis of SARS-CoV-2 by FDA under an Emergency Use Authorization (EUA). This EUA will remain  in effect (meaning this test can be used) for the duration of the COVID-19 declaration under Section 564(b)(1) of the Act, 21 U.S.C.section 360bbb-3(b)(1), unless the authorization is terminated  or revoked sooner.       Influenza A by PCR 07/14/2021 NEGATIVE  NEGATIVE Final   Influenza B by PCR 07/14/2021 NEGATIVE  NEGATIVE Final   Comment: (NOTE) The Xpert Xpress SARS-CoV-2/FLU/RSV plus assay is intended as an aid in the diagnosis of influenza from Nasopharyngeal swab specimens and should not be used as a sole basis for treatment. Nasal washings and aspirates are unacceptable for Xpert Xpress SARS-CoV-2/FLU/RSV testing.  Fact Sheet for Patients: EntrepreneurPulse.com.au  Fact Sheet for Healthcare  Providers: IncredibleEmployment.be  This test is not yet approved or cleared by the [place] FDA and has been authorized for detection and/or diagnosis of SARS-CoV-2 by FDA under an Emergency Use Authorization (EUA). This EUA will remain in effect (meaning this test can be used) for the duration of the COVID-19 declaration under Section 564(b)(1) of the Act, 21 U.S.C. section 360bbb-3(b)(1), unless the authorization is terminated or revoked.  Performed at [facility], [place] [address] [place], [place] [address]    WBC 07/14/2021 10.1  4.0 - 10.5 K/uL Final   RBC 07/14/2021 5.62  4.22 - 5.81 MIL/uL Final   Hemoglobin 07/14/2021 17.7 (H)  13.0 - 17.0 g/dL Final   HCT 07/14/2021 52.5 (H)  39.0 - 52.0 % Final   MCV 07/14/2021 93.4  80.0 - 100.0 fL Final   MCH 07/14/2021 31.5  26.0 - 34.0 pg Final   MCHC 07/14/2021 33.7  30.0 - 36.0 g/dL Final   RDW 07/14/2021 13.8  11.5 - 15.5 % Final   Platelets 07/14/2021 305  150 - 400 K/uL Final   nRBC 07/14/2021 0.0  0.0 - 0.2 % Final   Neutrophils Relative % 07/14/2021 65  % Final   Neutro Abs 07/14/2021 6.6  1.7 - 7.7 K/uL Final   Lymphocytes Relative 07/14/2021 24  % Final   Lymphs Abs 07/14/2021 2.5  0.7 - 4.0 K/uL Final   Monocytes Relative 07/14/2021 7  % Final   Monocytes Absolute 07/14/2021 0.7  0.1 - 1.0 K/uL Final   Eosinophils Relative 07/14/2021 2  % Final   Eosinophils Absolute 07/14/2021 0.2  0.0 - 0.5 K/uL Final   Basophils Relative 07/14/2021 1  % Final   Basophils Absolute 07/14/2021 0.1  0.0 - 0.1 K/uL Final   Immature Granulocytes 07/14/2021 1  % Final   Abs Immature Granulocytes 07/14/2021 0.06  0.00 - 0.07 K/uL Final   Performed at [facility], [place] [address], [place], [place] [address]   Sodium 07/14/2021 138  135 - 145 mmol/L Final   Potassium 07/14/2021 4.3  3.5 - 5.1 mmol/L Final   Chloride 07/14/2021 100  98 - 111 mmol/L Final   CO2 07/14/2021 28  22 - 32 mmol/L Final   Glucose,  Bld 07/14/2021 100 (H)  70 - 99 mg/dL Final   Glucose reference range applies only to samples taken after fasting for at least 8 hours.   BUN 07/14/2021 12  6 -  20 mg/dL Final   Creatinine, Ser 07/14/2021 0.91  0.61 - 1.24 mg/dL Final   Calcium 07/14/2021 10.1  8.9 - 10.3 mg/dL Final   Total Protein 07/14/2021 8.8 (H)  6.5 - 8.1 g/dL Final   Albumin 07/14/2021 4.8  3.5 - 5.0 g/dL Final   AST 07/14/2021 43 (H)  15 - 41 U/L Final   ALT 07/14/2021 79 (H)  0 - 44 U/L Final   Alkaline Phosphatase 07/14/2021 66  38 - 126 U/L Final   Total Bilirubin 07/14/2021 1.0  0.3 - 1.2 mg/dL Final   GFR, Estimated 07/14/2021 >60  >60 mL/min Final   Comment: (NOTE) Calculated using the CKD-EPI Creatinine Equation (2021)    Anion gap 07/14/2021 10  5 - 15 Final   Performed at [facility] [address], [place], [place] [address]   Alcohol, Ethyl (B) 07/14/2021 <10  <10 mg/dL Final   Comment: (NOTE) Lowest detectable limit for serum alcohol is 10 mg/dL.  For medical purposes only. Performed at [facility], [place] [address] [place], [place] [address]    TSH 07/14/2021 0.905  0.350 - 4.500 uIU/mL Final   Comment: Performed by a 3rd Generation assay with a functional sensitivity of <=0.01 uIU/mL. Performed at [facility], [place] [address], [place], [place] [address]    Color, Urine 07/14/2021 YELLOW  YELLOW Final   APPearance 07/14/2021 CLEAR  CLEAR Final   Specific Gravity, Urine 07/14/2021 1.011  1.005 - 1.030 Final   pH 07/14/2021 6.0  5.0 - 8.0 Final   Glucose, UA 07/14/2021 NEGATIVE  NEGATIVE mg/dL Final   Hgb urine dipstick 07/14/2021 NEGATIVE  NEGATIVE Final   Bilirubin Urine 07/14/2021 NEGATIVE  NEGATIVE Final   Ketones, ur 07/14/2021 NEGATIVE  NEGATIVE mg/dL Final   Protein, ur 07/14/2021 NEGATIVE  NEGATIVE mg/dL Final   Nitrite 07/14/2021 NEGATIVE  NEGATIVE Final   Leukocytes,Ua 07/14/2021 NEGATIVE  NEGATIVE Final   Performed at [facility], [place] [address],  [place], [place] [address]   POC Amphetamine UR 07/14/2021 None Detected  NONE DETECTED (Cut Off Level 1000 ng/mL) Final   POC Secobarbital (BAR) 07/14/2021 None Detected  NONE DETECTED (Cut Off Level 300 ng/mL) Final   POC Buprenorphine (BUP) 07/14/2021 None Detected  NONE DETECTED (Cut Off Level 10 ng/mL) Final   POC Oxazepam (BZO) 07/14/2021 None Detected  NONE DETECTED (Cut Off Level 300 ng/mL) Final   POC Cocaine UR 07/14/2021 None Detected  NONE DETECTED (Cut Off Level 300 ng/mL) Final   POC Methamphetamine UR 07/14/2021 None Detected  NONE DETECTED (Cut Off Level 1000 ng/mL) Final   POC Morphine 07/14/2021 None Detected  NONE DETECTED (Cut Off Level 300 ng/mL) Final   POC Methadone UR 07/14/2021 None Detected  NONE DETECTED (Cut Off Level 300 ng/mL) Final   POC Oxycodone UR 07/14/2021 None Detected  NONE DETECTED (Cut Off Level 100 ng/mL) Final   POC Marijuana UR 07/14/2021 Positive (A)  NONE DETECTED (Cut Off Level 50 ng/mL) Final   Cholesterol 07/14/2021 157  0 - 200 mg/dL Final   Triglycerides 07/14/2021 64  <150 mg/dL Final   HDL 07/14/2021 68  >40 mg/dL Final   Total CHOL/HDL Ratio 07/14/2021 2.3  RATIO Final   VLDL 07/14/2021 13  0 - 40 mg/dL Final   LDL Cholesterol 07/14/2021 76  0 - 99 mg/dL Final   Comment:        Total Cholesterol/HDL:CHD Risk Coronary Heart Disease Risk Table  Men   Women  1/2 Average Risk   3.4   3.3  Average Risk       5.0   4.4  2 X Average Risk   9.6   7.1  3 X Average Risk  23.4   11.0        Use the calculated Patient Ratio above and the CHD Risk Table to determine the patient's CHD Risk.        ATP III CLASSIFICATION (LDL):  <100     mg/dL   Optimal  100-129  mg/dL   Near or Above                    Optimal  130-159  mg/dL   Borderline  160-189  mg/dL   High  >190     mg/dL   Very High Performed at [place] [address], [place], [place] [address]   Admission on 06/23/2021, Discharged on 06/25/2021  Component  Date Value Ref Range Status   WBC 06/23/2021 12.1 (H)  4.0 - 10.5 K/uL Final   RBC 06/23/2021 4.50  4.22 - 5.81 MIL/uL Final   Hemoglobin 06/23/2021 14.1  13.0 - 17.0 g/dL Final   HCT 06/23/2021 42.1  39.0 - 52.0 % Final   MCV 06/23/2021 93.6  80.0 - 100.0 fL Final   MCH 06/23/2021 31.3  26.0 - 34.0 pg Final   MCHC 06/23/2021 33.5  30.0 - 36.0 g/dL Final   RDW 06/23/2021 13.6  11.5 - 15.5 % Final   Platelets 06/23/2021 267  150 - 400 K/uL Final   nRBC 06/23/2021 0.0  0.0 - 0.2 % Final   Neutrophils Relative % 06/23/2021 64  % Final   Neutro Abs 06/23/2021 7.6  1.7 - 7.7 K/uL Final   Lymphocytes Relative 06/23/2021 27  % Final   Lymphs Abs 06/23/2021 3.3  0.7 - 4.0 K/uL Final   Monocytes Relative 06/23/2021 6  % Final   Monocytes Absolute 06/23/2021 0.8  0.1 - 1.0 K/uL Final   Eosinophils Relative 06/23/2021 2  % Final   Eosinophils Absolute 06/23/2021 0.3  0.0 - 0.5 K/uL Final   Basophils Relative 06/23/2021 0  % Final   Basophils Absolute 06/23/2021 0.1  0.0 - 0.1 K/uL Final   Immature Granulocytes 06/23/2021 1  % Final   Abs Immature Granulocytes 06/23/2021 0.10 (H)  0.00 - 0.07 K/uL Final   Performed at [facility], [place] [address], [place], [place] [address]   Sodium 06/23/2021 139  135 - 145 mmol/L Final   Potassium 06/23/2021 3.6  3.5 - 5.1 mmol/L Final   Chloride 06/23/2021 104  98 - 111 mmol/L Final   CO2 06/23/2021 27  22 - 32 mmol/L Final   Glucose, Bld 06/23/2021 119 (H)  70 - 99 mg/dL Final   Glucose reference range applies only to samples taken after fasting for at least 8 hours.   BUN 06/23/2021 14  6 - 20 mg/dL Final   Creatinine, Ser 06/23/2021 0.85  0.61 - 1.24 mg/dL Final   Calcium 06/23/2021 9.0  8.9 - 10.3 mg/dL Final   Total Protein 06/23/2021 6.1 (L)  6.5 - 8.1 g/dL Final   Albumin 06/23/2021 3.3 (L)  3.5 - 5.0 g/dL Final   AST 06/23/2021 28  15 - 41 U/L Final   ALT 06/23/2021 52 (H)  0 - 44 U/L Final   Alkaline Phosphatase 06/23/2021 57  38 - 126 U/L  Final  Total Bilirubin 06/23/2021 0.2 (L)  0.3 - 1.2 mg/dL Final   GFR, Estimated 06/23/2021 >60  >60 mL/min Final   Comment: (NOTE) Calculated using the CKD-EPI Creatinine Equation (2021)    Anion gap 06/23/2021 8  5 - 15 Final   Performed at [facility] [address], [place], [place] [address]   Hgb A1c MFr Bld 06/23/2021 5.4  4.8 - 5.6 % Final   Comment: (NOTE) Pre diabetes:          5.7%-6.4%  Diabetes:              >6.4%  Glycemic control for   <7.0% adults with diabetes    Mean Plasma Glucose 06/23/2021 108.28  mg/dL Final   Performed at [facility], [place] [address], [place], [place] [address]   Alcohol, Ethyl (B) 06/23/2021 <10  <10 mg/dL Final   Comment: (NOTE) Lowest detectable limit for serum alcohol is 10 mg/dL.  For medical purposes only. Performed at [facility], [place] [address], [place], [place] [address]    Cholesterol 06/23/2021 102  0 - 200 mg/dL Final   Triglycerides 06/23/2021 159 (H)  <150 mg/dL Final   HDL 06/23/2021 53  >40 mg/dL Final   Total CHOL/HDL Ratio 06/23/2021 1.9  RATIO Final   VLDL 06/23/2021 32  0 - 40 mg/dL Final   LDL Cholesterol 06/23/2021 17  0 - 99 mg/dL Final   Comment:        Total Cholesterol/HDL:CHD Risk Coronary Heart Disease Risk Table                     Men   Women  1/2 Average Risk   3.4   3.3  Average Risk       5.0   4.4  2 X Average Risk   9.6   7.1  3 X Average Risk  23.4   11.0        Use the calculated Patient Ratio above and the CHD Risk Table to determine the patient's CHD Risk.        ATP III CLASSIFICATION (LDL):  <100     mg/dL   Optimal  100-129  mg/dL   Near or Above                    Optimal  130-159  mg/dL   Borderline  160-189  mg/dL   High  >190     mg/dL   Very High Performed at [facility] [address] [place], [place] [address]    TSH 06/23/2021 1.013  0.350 - 4.500 uIU/mL Final   Comment: Performed by a 3rd Generation assay with a functional sensitivity of  <=0.01 uIU/mL. Performed at [facility], [place] [address], [place], [place] [address]    RPR Ser Ql 06/23/2021 NON REACTIVE  NON REACTIVE Final   Performed at [facility], [place] [address], [place],  [address]   Hepatitis B Surface Ag 06/23/2021 NON REACTIVE  NON REACTIVE Final   HCV Ab 06/23/2021 Reactive (A)  NON REACTIVE Final   Comment: (NOTE) The CDC recommends that a Reactive HCV antibody result be followed up  with a HCV Nucleic Acid Amplification test.     Hep A IgM 06/23/2021 NON REACTIVE  NON REACTIVE Final   Hep B C IgM 06/23/2021 NON REACTIVE  NON REACTIVE Final   Performed at [facility], [place]  [address], [place], [place] [address]   POC Amphetamine UR 06/23/2021 None Detected  NONE DETECTED (Cut Off Level 1000 ng/mL) Final   POC Secobarbital (BAR) 06/23/2021 None Detected  NONE DETECTED (Cut Off Level 300 ng/mL) Final   POC Buprenorphine (BUP) 06/23/2021 None Detected  NONE DETECTED (Cut Off Level 10 ng/mL) Final   POC Oxazepam (BZO) 06/23/2021 None Detected  NONE DETECTED (Cut Off Level 300 ng/mL) Final   POC Cocaine UR 06/23/2021 None Detected  NONE DETECTED (Cut Off Level 300 ng/mL) Final   POC Methamphetamine UR 06/23/2021 Positive (A)  NONE DETECTED (Cut Off Level 1000 ng/mL) Final   POC Morphine 06/23/2021 None Detected  NONE DETECTED (Cut Off Level 300 ng/mL) Final   POC Methadone UR 06/23/2021 None Detected  NONE DETECTED (Cut Off Level 300 ng/mL) Final   POC Oxycodone UR 06/23/2021 None Detected  NONE DETECTED (Cut Off Level 100 ng/mL) Final   POC Marijuana UR 06/23/2021 Positive (A)  NONE DETECTED (Cut Off Level 50 ng/mL) Final   HIV Screen 4th Generation wRfx 06/23/2021 Non Reactive  Non Reactive Final   Performed at [facility], [place] [address], [place], [place] [address]   Neisseria Gonorrhea 06/23/2021 Negative   Final   Chlamydia 06/23/2021 Negative   Final   Comment 06/23/2021 Normal Reference [place] Chlamydia - Negative   Final    Comment 06/23/2021 Normal Reference Range Neisseria Gonorrhea - Negative   Final   HCV Ab 06/23/2021 Reactive (A)  Non Reactive Final   Comment: (NOTE) Performed At: [facility] [address] [place] [address] [place], [place] [id number] Rush Farmer MD [id number]    HCV RNA, Quantitation 06/23/2021 See Final Results  IU/mL Final   Test Information: 06/23/2021 Comment   Final   Comment: (NOTE) The quantitative range of this assay is 15 IU/mL to 100 million IU/mL. Performed At: [facility] [place], [place] [id number] Rush Farmer MD [id number]    HCV RNA - Quantitation 06/23/2021 25,700,000  IU/mL Final   Comment: (NOTE)                HCV RNA detected HCV RNA viral loads >/= 25 IU/mL indicate current HCV infection.    HCV RNA - log10 06/23/2021 7.410  log10 IU/mL Final   Comment: (NOTE) Performed At: [facility] [place], [place] [id number] Rush Farmer MD [id number]    Hepatitis C Quantitation 06/23/2021 See Final Results  IU/mL Final   Test Information (HCV): 06/23/2021 Comment   Final   Comment: (NOTE) The quantitative range of this assay is 15 IU/mL to 100 million IU/mL.    Interpretation (HCV): 06/23/2021 Comment   Final   Comment: (NOTE) Positive HCV antibody screen with the presence of HCV RNA is consistent with active infection. Performed At: [facility] [place], [place] [id number] Rush Farmer MD [id number]    HCV RNA (International Units) 06/23/2021 12,900,000  IU/mL Final   HCV log10 06/23/2021 7.111  log10 IU/mL Final   Comment: (NOTE) Performed At: [facility] [place], [place] [id number] Rush Farmer MD [id number]   Hospital Outpatient Visit on 06/23/2021  Component Date Value Ref Range Status   SARS Coronavirus 2 by RT PCR 06/23/2021 NEGATIVE  NEGATIVE Final   Comment: (NOTE) SARS-CoV-2 target nucleic acids are NOT DETECTED.  The  SARS-CoV-2 RNA is generally detectable in upper respiratory specimens during the acute phase of infection. The lowest concentration of SARS-CoV-2 viral copies this assay can  detect is 138 copies/mL. A negative result does not preclude SARS-Cov-2 infection and should not be used as the sole basis for treatment or other patient management decisions. A negative result may occur with  improper specimen collection/handling, submission of specimen other than nasopharyngeal swab, presence of viral mutation(s) within the areas targeted by this assay, and inadequate number of viral copies(<138 copies/mL). A negative result must be combined with clinical observations, patient history, and epidemiological information. The expected result is Negative.  Fact Sheet for Patients:  EntrepreneurPulse.com.au  Fact Sheet for Healthcare Providers:  IncredibleEmployment.be  This test is no                          t yet approved or cleared by the [place] FDA and  has been authorized for detection and/or diagnosis of SARS-CoV-2 by FDA under an Emergency Use Authorization (EUA). This EUA will remain  in effect (meaning this test can be used) for the duration of the COVID-19 declaration under Section 564(b)(1) of the Act, 21 U.S.C.section 360bbb-3(b)(1), unless the authorization is terminated  or revoked sooner.       Influenza A by PCR 06/23/2021 NEGATIVE  NEGATIVE Final   Influenza B by PCR 06/23/2021 NEGATIVE  NEGATIVE Final   Comment: (NOTE) The Xpert Xpress SARS-CoV-2/FLU/RSV plus assay is intended as an aid in the diagnosis of influenza from Nasopharyngeal swab specimens and should not be used as a sole basis for treatment. Nasal washings and aspirates are unacceptable for Xpert Xpress SARS-CoV-2/FLU/RSV testing.  Fact Sheet for Patients: EntrepreneurPulse.com.au  Fact Sheet for Healthcare  Providers: IncredibleEmployment.be  This test is not yet approved or cleared by the [place] FDA and has been authorized for detection and/or diagnosis of SARS-CoV-2 by FDA under an Emergency Use Authorization (EUA). This EUA will remain in effect (meaning this test can be used) for the duration of the COVID-19 declaration under Section 564(b)(1) of the Act, 21 U.S.C. section 360bbb-3(b)(1), unless the authorization is terminated or revoked.  Performed at [facility], [place] [address] [place], [place] [address]     Allergies: Fish allergy, Shellfish allergy, Tramadol, and Ketorolac  PTA Medications: (Not in a hospital admission)   Medical Decision Making  Patient was transferred from [facility] as a walk-in due to suicidal ideations.  Provider recommended inpatient admission.    However upon reassessment patient is requesting additional outpatient resources and to be restarted on medication. This provider will recommend overnight observation for medication management patient to be reassessed daily for reported symptoms. -Anticipated discharge 11/08/2021     Recommendations  Based on my evaluation the patient does not appear to have an emergency medical condition.  CBC , CMP, TSH and UDS pending results  -Restarted Seroquel 50 mg nightly -Restarted gabapentin 100 mg p.o. twice daily  Derrill Center, NP 11/07/21  5:13 PM

## 2021-11-07 NOTE — Discharge Instructions (Signed)
Take all medications as prescribed. Keep all follow-up appointments as scheduled.  Do not consume alcohol or use illegal drugs while on prescription medications. Report any adverse effects from your medications to your primary care provider promptly.  In the event of recurrent symptoms or worsening symptoms, call 911, a crisis hotline, or go to the nearest emergency department for evaluation.   

## 2021-11-07 NOTE — ED Provider Notes (Signed)
FBC/OBS ASAP Discharge Summary  Date and Time: 11/07/2021 5:14 PM  Name: Nathan Escobar  MRN:  [id number]   Discharge Diagnoses:  Final diagnoses:  Severe bipolar I disorder, most recent episode depressed (HCC)    Subjective: Patient requested to discharge " if I am not staying longer then I want to go."   Stay Summary: Nathan Escobar 36 year old Caucasian male initially presented to [facility] behavioral health citing suicidal ideations with plan to overdose on heroin.  Patient was transferred to [facility] urgent care facility.  Upon reassessment patient is requesting to be restarted on medications and needing additional outpatient resources for therapy and psychiatry. -Patient is unable to recall medications he was prescribed in the past.  States has been suicidal for the past month.  Patient is requesting to be transferred to [facility] for his substance abuse history.   Lindley reports upcoming court date 11/09/2021 states his probation officer requested that he follow-up with his mental health prior to his court date. NP will restart gabapentin 100 mg p.o. twice daily and Seroquel 50 mg p.o. nightly.  CBC , CMP, TSH and UDS pending results   Per initial assessment note: " Nathan Escobar is a 36 y.o. Caucasian male who presents voluntarily as a walk-in to [facility] for worsening depressive symptoms, suicidal ideation, and auditory hallucinations.  Patient has past psychiatric diagnoses of amphetamine use disorder severe, amphetamine induced depressive disorder, antisocial personality disorder, cannabis use disorder, cocaine use, history of alcohol abuse, malingering, nicotine dependence, polysubstance abuse, posttraumatic stress, severe bipolar 1 disorder most recent episode depressed, severe opioid use disorder, and stimulant use disorder.  This is the third psychiatric admission in this [facility] for this 36 year old male.  Patient has multiple ED visits about 7 times this year."    Total Time spent with patient: 15 minutes  Past Psychiatric History:  Past Medical History:  Past Medical History:  Diagnosis Date   Anxiety    Depression    Hepatitis C    Heroin abuse (HCC)    IVDU (intravenous drug user)    Marijuana use    Methamphetamine abuse (HCC)    PTSD (post-traumatic stress disorder)    No past surgical history on file. Family History:  Family History  Problem Relation Age of Onset   Depression Mother    Drug abuse Father    Bipolar disorder Father    Suicidality Paternal Aunt    Bipolar disorder Paternal Aunt    Family Psychiatric History:  Social History:  Social History   Substance and Sexual Activity  Alcohol Use None     Social History   Substance and Sexual Activity  Drug Use Yes   Types: Heroin, Methamphetamines, Marijuana    Social History   Socioeconomic History   Marital status: Widowed    Spouse name: Not on file   Number of children: Not on file   Years of education: Not on file   Highest education level: Not on file  Occupational History   Not on file  Tobacco Use   Smoking status: Every Day    Packs/day: 1.50    Years: 5.00    Total pack years: 7.50    Types: Cigarettes   Smokeless tobacco: Not on file  Substance and Sexual Activity   Alcohol use: Not on file   Drug use: Yes    Types: Heroin, Methamphetamines, Marijuana   Sexual activity: Yes  Other Topics Concern   Not on file  Social History Narrative  Not on file   Social Determinants of Health   Financial Resource Strain: Not on file  Food Insecurity: Not on file  Transportation Needs: Not on file  Physical Activity: Not on file  Stress: Not on file  Social Connections: Not on file   SDOH:  SDOH Screenings   Alcohol Screen: Low Risk  (07/14/2021)  Tobacco Use: High Risk (07/15/2021)    Tobacco Cessation:  N/A, patient does not currently use tobacco products  Current Medications:  Current Facility-Administered Medications  Medication  Dose Route Frequency Provider Last Rate Last Admin   acetaminophen (TYLENOL) tablet 650 mg  650 mg Oral Q6H PRN Derrill Center, NP       alum & mag hydroxide-simeth (MAALOX/MYLANTA) 200-200-20 MG/5ML suspension 30 mL  30 mL Oral Q4H PRN Derrill Center, NP       gabapentin (NEURONTIN) capsule 100 mg  100 mg Oral BID Derrill Center, NP       magnesium hydroxide (MILK OF MAGNESIA) suspension 30 mL  30 mL Oral Daily PRN Derrill Center, NP       QUEtiapine (SEROQUEL) tablet 50 mg  50 mg Oral QHS Derrill Center, NP       traZODone (DESYREL) tablet 50 mg  50 mg Oral QHS PRN Derrill Center, NP       Current Outpatient Medications  Medication Sig Dispense Refill   gabapentin (NEURONTIN) 400 MG capsule Take 1 capsule (400 mg total) by mouth 3 (three) times daily. 90 capsule 0   hydrOXYzine (ATARAX) 25 MG tablet Take 1 tablet (25 mg total) by mouth 3 (three) times daily as needed for anxiety. 10 tablet 0   nicotine (NICODERM CQ - DOSED IN MG/24 HOURS) 14 mg/24hr patch Place 1 patch (14 mg total) onto the skin daily. 28 patch 0   QUEtiapine (SEROQUEL) 100 MG tablet Take 1 tablet (100 mg total) by mouth at bedtime. 30 tablet 0   traZODone (DESYREL) 50 MG tablet Take 1 tablet (50 mg total) by mouth at bedtime as needed for sleep. 15 tablet 0    PTA Medications: (Not in a hospital admission)       No data to display          [place] OP Visit from 11/07/2021 in [place] Most recent reading at 11/07/2021 12:59 PM Admission (Discharged) from 07/14/2021 in [facility] Most recent reading at 07/14/2021 10:20 PM ED from 07/14/2021 in [facility] Most recent reading at 07/14/2021 12:32 PM  C-SSRS RISK CATEGORY Moderate Risk No Risk No Risk       Musculoskeletal  Strength & Muscle Tone: within normal limits Gait & Station: normal Patient leans: N/A  Psychiatric Specialty Exam  Presentation   General Appearance:  Disheveled  Eye Contact: Fair  Speech: Clear and Coherent; Normal Rate  Speech Volume: Normal  Handedness: Right   Mood and Affect  Mood: Anxious; Dysphoric  Affect: Congruent   Thought Process  Thought Processes: Linear  Descriptions of Associations:Intact  Orientation:Full (Time, Place and Person)  Thought Content:Logical  Diagnosis of Schizophrenia or Schizoaffective disorder in past: No    Hallucinations:Hallucinations: Auditory Description of Auditory Hallucinations: Hearing people call his name  Ideas of Reference:None  Suicidal Thoughts:Suicidal Thoughts: Yes, Passive SI Passive Intent and/or Plan: Without Plan  Homicidal Thoughts:Homicidal Thoughts: No   Sensorium  Memory: Immediate Fair; Remote Fair; Recent Fair  Judgment: Poor  Insight: Poor  Executive Functions  Concentration: Fair  Attention Span: Fair  Recall: [employer] of Knowledge: Fair  Language: Fair   Psychomotor Activity  Psychomotor Activity: Psychomotor Activity: Restlessness; Increased   Assets  Assets: Communication Skills; Physical Health   Sleep  Sleep: Sleep: Poor Number of Hours of Sleep: 0.5   No data recorded  Physical Exam  Physical Exam ROS Blood pressure 109/85, pulse 79, temperature 98 F (36.7 C), temperature source Oral, resp. rate 18, SpO2 100 %. There is no height or weight on file to calculate BMI.  Demographic Factors:  NA  Loss Factors: Financial problems/change in socioeconomic status  Historical Factors: NA  Risk Reduction Factors:   NA  Continued Clinical Symptoms:  Depression:   Comorbid alcohol abuse/dependence  Cognitive Features That Contribute To Risk:  Loss of executive function    Suicide Risk:  Minimal: No identifiable suicidal ideation.  Patients presenting with no risk factors but with morbid ruminations; may be classified as minimal risk based on the severity of the  depressive symptoms  Plan Of Care/Follow-up recommendations:  Activity:  as tolerated  Disposition: Take all medications as prescribed. Keep all follow-up appointments as scheduled.  Do not consume alcohol or use illegal drugs while on prescription medications. Report any adverse effects from your medications to your primary care provider promptly.  In the event of recurrent symptoms or worsening symptoms, call 911, a crisis hotline, or go to the nearest emergency department for evaluation.    Oneta Rack, NP 11/07/2021, 5:14 PM
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
## 2023-06-28 ENCOUNTER — Observation Stay (HOSPITAL_COMMUNITY)
Admission: EM | Admit: 2023-06-28 | Discharge: 2023-06-30 | Disposition: A | Discharge status: Home / Self Care | Payer: MEDICAID | Attending: Internal Medicine | Admitting: Internal Medicine

## 2023-06-28 ENCOUNTER — Other Ambulatory Visit: Payer: Self-pay

## 2023-06-28 ENCOUNTER — Emergency Department (HOSPITAL_COMMUNITY): Payer: MEDICAID

## 2023-06-28 ENCOUNTER — Encounter (HOSPITAL_COMMUNITY): Payer: Self-pay

## 2023-06-28 DIAGNOSIS — F314 Bipolar disorder, current episode depressed, severe, without psychotic features: Secondary | ICD-10-CM

## 2023-06-28 DIAGNOSIS — R45851 Suicidal ideations: Principal | ICD-10-CM

## 2023-06-28 DIAGNOSIS — R109 Unspecified abdominal pain: Secondary | ICD-10-CM | POA: Diagnosis not present

## 2023-06-28 DIAGNOSIS — D751 Secondary polycythemia: Secondary | ICD-10-CM | POA: Diagnosis present

## 2023-06-28 DIAGNOSIS — R7989 Other specified abnormal findings of blood chemistry: Secondary | ICD-10-CM | POA: Diagnosis not present

## 2023-06-28 DIAGNOSIS — R6510 Systemic inflammatory response syndrome (SIRS) of non-infectious origin without acute organ dysfunction: Secondary | ICD-10-CM | POA: Diagnosis not present

## 2023-06-28 DIAGNOSIS — Z1152 Encounter for screening for COVID-19: Secondary | ICD-10-CM | POA: Insufficient documentation

## 2023-06-28 DIAGNOSIS — F1910 Other psychoactive substance abuse, uncomplicated: Secondary | ICD-10-CM | POA: Diagnosis present

## 2023-06-28 DIAGNOSIS — E162 Hypoglycemia, unspecified: Secondary | ICD-10-CM | POA: Diagnosis present

## 2023-06-28 DIAGNOSIS — F17210 Nicotine dependence, cigarettes, uncomplicated: Secondary | ICD-10-CM | POA: Insufficient documentation

## 2023-06-28 DIAGNOSIS — Z79899 Other long term (current) drug therapy: Secondary | ICD-10-CM | POA: Insufficient documentation

## 2023-06-28 DIAGNOSIS — F10129 Alcohol abuse with intoxication, unspecified: Secondary | ICD-10-CM | POA: Diagnosis present

## 2023-06-28 DIAGNOSIS — F39 Unspecified mood [affective] disorder: Secondary | ICD-10-CM | POA: Diagnosis not present

## 2023-06-28 DIAGNOSIS — F4310 Post-traumatic stress disorder, unspecified: Secondary | ICD-10-CM

## 2023-06-28 DIAGNOSIS — R7401 Elevation of levels of liver transaminase levels: Secondary | ICD-10-CM | POA: Insufficient documentation

## 2023-06-28 DIAGNOSIS — B1920 Unspecified viral hepatitis C without hepatic coma: Secondary | ICD-10-CM

## 2023-06-28 LAB — PROTIME-INR
INR: 1 (ref 0.8–1.2)
Prothrombin Time: 13.2 s (ref 11.4–15.2)

## 2023-06-28 LAB — CBC WITH DIFFERENTIAL/PLATELET
Abs Immature Granulocytes: 0.22 10*3/uL — ABNORMAL HIGH (ref 0.00–0.07)
Basophils Absolute: 0.1 10*3/uL (ref 0.0–0.1)
Basophils Relative: 0 %
Eosinophils Absolute: 0.1 10*3/uL (ref 0.0–0.5)
Eosinophils Relative: 0 %
HCT: 50.5 % (ref 39.0–52.0)
Hemoglobin: 17.3 g/dL — ABNORMAL HIGH (ref 13.0–17.0)
Immature Granulocytes: 1 %
Lymphocytes Relative: 12 %
Lymphs Abs: 3.2 10*3/uL (ref 0.7–4.0)
MCH: 31.9 pg (ref 26.0–34.0)
MCHC: 34.3 g/dL (ref 30.0–36.0)
MCV: 93 fL (ref 80.0–100.0)
Monocytes Absolute: 1.1 10*3/uL — ABNORMAL HIGH (ref 0.1–1.0)
Monocytes Relative: 4 %
Neutro Abs: 22.5 10*3/uL — ABNORMAL HIGH (ref 1.7–7.7)
Neutrophils Relative %: 83 %
Platelets: 476 10*3/uL — ABNORMAL HIGH (ref 150–400)
RBC: 5.43 MIL/uL (ref 4.22–5.81)
RDW: 13.1 % (ref 11.5–15.5)
WBC: 27.1 10*3/uL — ABNORMAL HIGH (ref 4.0–10.5)
nRBC: 0 % (ref 0.0–0.2)

## 2023-06-28 LAB — CBG MONITORING, ED
Glucose-Capillary: 58 mg/dL — ABNORMAL LOW (ref 70–99)
Glucose-Capillary: 135 mg/dL — ABNORMAL HIGH (ref 70–99)

## 2023-06-28 LAB — COMPREHENSIVE METABOLIC PANEL WITH GFR
ALT: 106 U/L — ABNORMAL HIGH (ref 0–44)
AST: 94 U/L — ABNORMAL HIGH (ref 15–41)
Albumin: 4.4 g/dL (ref 3.5–5.0)
Alkaline Phosphatase: 63 U/L (ref 38–126)
Anion gap: 15 (ref 5–15)
BUN: 12 mg/dL (ref 6–20)
CO2: 21 mmol/L — ABNORMAL LOW (ref 22–32)
Calcium: 9.1 mg/dL (ref 8.9–10.3)
Chloride: 104 mmol/L (ref 98–111)
Creatinine, Ser: 1.22 mg/dL (ref 0.61–1.24)
GFR, Estimated: >60 mL/min (ref >60)
Glucose, Bld: 57 mg/dL — ABNORMAL LOW (ref 70–99)
Potassium: 3.6 mmol/L (ref 3.5–5.1)
Sodium: 140 mmol/L (ref 135–145)
Total Bilirubin: 1 mg/dL (ref 0.0–1.2)
Total Protein: 8.3 g/dL — ABNORMAL HIGH (ref 6.5–8.1)

## 2023-06-28 LAB — I-STAT CG4 LACTIC ACID, ED
Lactic Acid, Venous: 2.2 mmol/L (ref 0.5–1.9)
Lactic Acid, Venous: 3.5 mmol/L (ref 0.5–1.9)

## 2023-06-28 LAB — TROPONIN I (HIGH SENSITIVITY): Troponin I (High Sensitivity): 22 ng/L — ABNORMAL HIGH (ref <18)

## 2023-06-28 LAB — RESP PANEL BY RT-PCR (RSV, FLU A&B, COVID)  RVPGX2
Influenza A by PCR: NEGATIVE
Influenza B by PCR: NEGATIVE
Resp Syncytial Virus by PCR: NEGATIVE
SARS Coronavirus 2 by RT PCR: NEGATIVE

## 2023-06-28 LAB — LIPASE, BLOOD: Lipase: 26 U/L (ref 11–51)

## 2023-06-28 LAB — ETHANOL: Alcohol, Ethyl (B): 150 mg/dL — ABNORMAL HIGH (ref <15)

## 2023-06-28 LAB — MAGNESIUM: Magnesium: 2.1 mg/dL (ref 1.7–2.4)

## 2023-06-28 MED ORDER — DEXTROSE 50 % IV SOLN
1.0000 | Freq: Once | INTRAVENOUS | Status: AC
  Administered 2023-06-28: 50 mL via INTRAVENOUS
  Filled 2023-06-28: qty 50

## 2023-06-28 MED ORDER — DEXTROSE IN LACTATED RINGERS 5 % IV SOLN: INTRAVENOUS | Status: DC

## 2023-06-28 MED ORDER — LORAZEPAM 2 MG/ML IJ SOLN
1.0000 mg | Freq: Once | INTRAMUSCULAR | Status: AC
  Administered 2023-06-28: 1 mg via INTRAVENOUS
  Filled 2023-06-28: qty 1

## 2023-06-28 MED ORDER — HYDROMORPHONE HCL 1 MG/ML IJ SOLN
1.0000 mg | Freq: Once | INTRAMUSCULAR | Status: AC
  Administered 2023-06-28: 1 mg via INTRAVENOUS
  Filled 2023-06-28: qty 1

## 2023-06-28 MED ORDER — THIAMINE HCL 100 MG/ML IJ SOLN: 100.0000 mg | Freq: Every day | INTRAMUSCULAR | Status: DC

## 2023-06-28 MED ORDER — SODIUM CHLORIDE 0.9 % IV SOLN
2.0000 g | Freq: Once | INTRAVENOUS | Status: AC
  Administered 2023-06-28: 2 g via INTRAVENOUS
  Filled 2023-06-28: qty 12.5

## 2023-06-28 MED ORDER — PANTOPRAZOLE SODIUM 40 MG IV SOLR
40.0000 mg | Freq: Once | INTRAVENOUS | Status: AC
  Administered 2023-06-28: 40 mg via INTRAVENOUS
  Filled 2023-06-28: qty 10

## 2023-06-28 MED ORDER — IOHEXOL 350 MG/ML SOLN
75.0000 mL | Freq: Once | INTRAVENOUS | Status: AC | PRN
  Administered 2023-06-28: 75 mL via INTRAVENOUS

## 2023-06-28 MED ORDER — LORAZEPAM 2 MG/ML IJ SOLN: 0.0000 mg | Freq: Four times a day (QID) | INTRAMUSCULAR | Status: DC

## 2023-06-28 MED ORDER — METRONIDAZOLE 500 MG/100ML IV SOLN
500.0000 mg | Freq: Once | INTRAVENOUS | Status: AC
  Administered 2023-06-28: 500 mg via INTRAVENOUS
  Filled 2023-06-28: qty 100

## 2023-06-28 MED ORDER — LACTATED RINGERS IV BOLUS
1000.0000 mL | Freq: Once | INTRAVENOUS | Status: AC
  Administered 2023-06-28: 1000 mL via INTRAVENOUS

## 2023-06-28 MED ORDER — THIAMINE HCL 100 MG/ML IJ SOLN
100.0000 mg | Freq: Once | INTRAMUSCULAR | Status: AC
  Administered 2023-06-28: 100 mg via INTRAVENOUS
  Filled 2023-06-28: qty 2

## 2023-06-28 MED ORDER — LORAZEPAM 1 MG PO TABS
0.0000 mg | ORAL_TABLET | Freq: Four times a day (QID) | ORAL | Status: DC
  Administered 2023-06-29 (×2): 1 mg via ORAL
  Filled 2023-06-28 (×2): qty 1

## 2023-06-28 MED ORDER — LORAZEPAM 1 MG PO TABS: 0.0000 mg | ORAL_TABLET | Freq: Two times a day (BID) | ORAL | Status: DC

## 2023-06-28 MED ORDER — THIAMINE MONONITRATE 100 MG PO TABS
100.0000 mg | ORAL_TABLET | Freq: Every day | ORAL | Status: DC
  Administered 2023-06-29 – 2023-06-30 (×2): 100 mg via ORAL
  Filled 2023-06-28 (×2): qty 1

## 2023-06-28 MED ORDER — ONDANSETRON HCL 4 MG/2ML IJ SOLN
4.0000 mg | Freq: Once | INTRAMUSCULAR | Status: AC
  Administered 2023-06-28: 4 mg via INTRAVENOUS
  Filled 2023-06-28: qty 2

## 2023-06-28 MED ORDER — VANCOMYCIN HCL IN DEXTROSE 1-5 GM/200ML-% IV SOLN
1000.0000 mg | Freq: Once | INTRAVENOUS | Status: AC
  Administered 2023-06-29: 1000 mg via INTRAVENOUS
  Filled 2023-06-28: qty 200

## 2023-06-28 MED ORDER — LORAZEPAM 2 MG/ML IJ SOLN: 0.0000 mg | Freq: Two times a day (BID) | INTRAMUSCULAR | Status: DC

## 2023-06-28 MED ORDER — LACTATED RINGERS IV BOLUS
1000.0000 mL | Freq: Once | INTRAVENOUS | Status: AC
Start: 2023-06-28 — End: 2023-06-29
  Administered 2023-06-28: 1000 mL via INTRAVENOUS

## 2023-06-28 NOTE — ED Provider Notes (Signed)
 [place] EMERGENCY DEPARTMENT AT [facility] Provider Note   CSN: [id number] Arrival date & time: 06/28/23  1725     History {Add pertinent medical, surgical, social history, OB history to HPI:1} No chief complaint on file.  HPI Nathan Escobar is a 38 y.o. male with history of polysubstance abuse presenting for suicidal ideation and binge drinking.  He states in the last 2 or 3 days he "blew through $400" purchasing alcohol.  States he has been drinking a 12 pack/day along with 3-5ths of liquor and intermittent marijuana use.  States she has had thoughts of suicide but no plan.  Also mentioned that he has had chest pain that started sometime yesterday.  He states that it feels like "someone is sitting on my chest.  Chest pain is in the center of the chest but radiates to the left and rightward and unsure if it is worse with exertion.  Also endorses intermittent shortness of breath.  States in the past 2 days as well he has been coughing up bright red blood.  HPI     Home Medications Prior to Admission medications   Medication Sig Start Date End Date Taking? Authorizing Provider  gabapentin  (NEURONTIN ) 400 MG capsule Take 1 capsule (400 mg total) by mouth 3 (three) times daily. 07/17/21   Floyce Hutching, MD  hydrOXYzine  (ATARAX ) 25 MG tablet Take 1 tablet (25 mg total) by mouth 3 (three) times daily as needed for anxiety. 07/17/21   Floyce Hutching, MD  nicotine  (NICODERM CQ  - DOSED IN MG/24 HOURS) 14 mg/24hr patch Place 1 patch (14 mg total) onto the skin daily. 07/18/21   Floyce Hutching, MD  QUEtiapine  (SEROQUEL ) 100 MG tablet Take 1 tablet (100 mg total) by mouth at bedtime. 07/17/21   Floyce Hutching, MD  traZODone  (DESYREL ) 50 MG tablet Take 1 tablet (50 mg total) by mouth at bedtime as needed for sleep. 07/17/21   Floyce Hutching, MD      Allergies    Fish allergy, Shellfish allergy, Tramadol, and Ketorolac     Review of Systems   Review of  Systems  Physical Exam Updated Vital Signs BP 100/69 (BP Location: Right Arm)   Pulse (!) 114   Temp 98.2 F (36.8 C)   Resp (!) 28   SpO2 96%  Physical Exam  ED Results / Procedures / Treatments   Labs (all labs ordered are listed, but only abnormal results are displayed) Labs Reviewed  COMPREHENSIVE METABOLIC PANEL WITH GFR - Abnormal; Notable for the following components:      Result Value   CO2 21 (*)    Glucose, Bld 57 (*)    Total Protein 8.3 (*)    AST 94 (*)    ALT 106 (*)    All other components within normal limits  ETHANOL - Abnormal; Notable for the following components:   Alcohol, Ethyl (B) 150 (*)    All other components within normal limits  CBC WITH DIFFERENTIAL/PLATELET - Abnormal; Notable for the following components:   WBC 27.1 (*)    Hemoglobin 17.3 (*)    Platelets 476 (*)    Neutro Abs 22.5 (*)    Monocytes Absolute 1.1 (*)    Abs Immature Granulocytes 0.22 (*)    All other components within normal limits  RESP PANEL BY RT-PCR (RSV, FLU A&B, COVID)  RVPGX2  CULTURE, BLOOD (ROUTINE X 2)  CULTURE, BLOOD (ROUTINE X 2)  LIPASE, BLOOD  RAPID URINE DRUG SCREEN,  HOSP PERFORMED  MAGNESIUM   PROTIME-INR  URINALYSIS, W/ REFLEX TO CULTURE (INFECTION SUSPECTED)  I-STAT CG4 LACTIC ACID, ED  TROPONIN I (HIGH SENSITIVITY)    EKG EKG Interpretation Date/Time:  Tuesday Jun 28 2023 18:52:50 EDT Ventricular Rate:  88 PR Interval:  136 QRS Duration:  88 QT Interval:  358 QTC Calculation: 433 R Axis:   84  Text Interpretation: Normal sinus rhythm Minimal voltage criteria for LVH, may be normal variant ( Cornell product ) Septal infarct , age undetermined Abnormal ECG Confirmed by Iva Mariner [phone]) on 06/28/2023 8:36:46 PM  Radiology DG Chest Portable 1 View Result Date: 06/28/2023 CLINICAL DATA:  Hemoptysis. EXAM: PORTABLE CHEST 1 VIEW COMPARISON:  September 30, 2018 FINDINGS: The heart size and mediastinal contours are within normal limits. Both lungs are  clear. The visualized skeletal structures are unremarkable. IMPRESSION: No active disease. Electronically Signed   By: Virgle Grime M.D.   On: 06/28/2023 19:37    Procedures Procedures  {Document cardiac monitor, telemetry assessment procedure when appropriate:1}  Medications Ordered in ED Medications  thiamine (VITAMIN B1) injection 100 mg (has no administration in time range)  dextrose 5 % in lactated ringers infusion (has no administration in time range)  lactated ringers bolus 1,000 mL (has no administration in time range)  ceFEPIme (MAXIPIME) 2 g in sodium chloride 0.9 % 100 mL IVPB (has no administration in time range)  metroNIDAZOLE (FLAGYL) IVPB 500 mg (has no administration in time range)  vancomycin (VANCOCIN) IVPB 1000 mg/200 mL premix (has no administration in time range)  HYDROmorphone (DILAUDID) injection 1 mg (has no administration in time range)  LORazepam (ATIVAN) injection 1 mg (has no administration in time range)  ondansetron  (ZOFRAN ) injection 4 mg (has no administration in time range)    ED Course/ Medical Decision Making/ A&P   {   Click here for ABCD2, HEART and other calculatorsREFRESH Note before signing :1}                              Medical Decision Making Amount and/or Complexity of Data Reviewed Labs: ordered. Radiology: ordered.  Risk Prescription drug management.   ***  {Document critical care time when appropriate:1} {Document review of labs and clinical decision tools ie heart score, Chads2Vasc2 etc:1}  {Document your independent review of radiology images, and any outside records:1} {Document your discussion with family members, caretakers, and with consultants:1} {Document social determinants of health affecting pt's care:1} {Document your decision making why or why not admission, treatments were needed:1} Final Clinical Impression(s) / ED Diagnoses Final diagnoses:  None    Rx / DC Orders ED Discharge Orders     None

## 2023-06-28 NOTE — Progress Notes (Signed)
 Elink monitoring for the code sepsis protocol.

## 2023-06-28 NOTE — ED Triage Notes (Addendum)
 Per peer worker from mental health facility; pt requesting detox from alcohol, not taking meds; states pt boarded up his house, barricading self in; pt endorsing SI; no plan at present; pt answering questions appropriately; states he's been off meds x 1 year; drinks 12 pack/day typically,  has been through 3-5ths of liquor over past few days; drank today; endorses marijuana use; denies pain; hx hep c; endorses cough up bright red blood over past few days; denies fevers

## 2023-06-28 NOTE — ED Notes (Signed)
 Pt changing into scrubs now

## 2023-06-28 NOTE — ED Provider Triage Note (Signed)
 Emergency Medicine Provider Triage Evaluation Note  Hubert Raatz , a 38 y.o. male  was evaluated in triage.  Pt complains of suicidal thoughts, also complaining of daily alcohol use and coughing up blood for the past several days.  Review of Systems  Positive:  Negative:   Physical Exam  BP 100/69 (BP Location: Right Arm)   Pulse (!) 114   Temp 98.2 F (36.8 C)   Resp (!) 28   SpO2 96%  Gen:   Awake, no distress   Resp:  Normal effort  MSK:   Moves extremities without difficulty  Other:    Medical Decision Making  Medically screening exam initiated at 6:05 PM.  Appropriate orders placed.  Nikan Ellingson was informed that the remainder of the evaluation will be completed by another provider, this initial triage assessment does not replace that evaluation, and the importance of remaining in the ED until their evaluation is complete.     Aimee Houseman, [place] 06/28/23 1610

## 2023-06-28 NOTE — ED Notes (Signed)
Belongings placed in locker number 7

## 2023-06-29 ENCOUNTER — Observation Stay (HOSPITAL_COMMUNITY): Payer: MEDICAID

## 2023-06-29 DIAGNOSIS — R45851 Suicidal ideations: Principal | ICD-10-CM

## 2023-06-29 DIAGNOSIS — F10129 Alcohol abuse with intoxication, unspecified: Secondary | ICD-10-CM | POA: Diagnosis present

## 2023-06-29 DIAGNOSIS — R6510 Systemic inflammatory response syndrome (SIRS) of non-infectious origin without acute organ dysfunction: Principal | ICD-10-CM

## 2023-06-29 DIAGNOSIS — F1910 Other psychoactive substance abuse, uncomplicated: Secondary | ICD-10-CM

## 2023-06-29 DIAGNOSIS — E162 Hypoglycemia, unspecified: Secondary | ICD-10-CM

## 2023-06-29 DIAGNOSIS — F39 Unspecified mood [affective] disorder: Secondary | ICD-10-CM

## 2023-06-29 DIAGNOSIS — D751 Secondary polycythemia: Secondary | ICD-10-CM | POA: Diagnosis not present

## 2023-06-29 DIAGNOSIS — R7989 Other specified abnormal findings of blood chemistry: Secondary | ICD-10-CM | POA: Diagnosis present

## 2023-06-29 LAB — COMPREHENSIVE METABOLIC PANEL WITH GFR
ALT: 80 U/L — ABNORMAL HIGH (ref 0–44)
AST: 64 U/L — ABNORMAL HIGH (ref 15–41)
Albumin: 3.1 g/dL — ABNORMAL LOW (ref 3.5–5.0)
Alkaline Phosphatase: 49 U/L (ref 38–126)
Anion gap: 4 — ABNORMAL LOW (ref 5–15)
BUN: 16 mg/dL (ref 6–20)
CO2: 24 mmol/L (ref 22–32)
Calcium: 8.1 mg/dL — ABNORMAL LOW (ref 8.9–10.3)
Chloride: 104 mmol/L (ref 98–111)
Creatinine, Ser: 1.07 mg/dL (ref 0.61–1.24)
GFR, Estimated: >60 mL/min (ref >60)
Glucose, Bld: 73 mg/dL (ref 70–99)
Potassium: 4.4 mmol/L (ref 3.5–5.1)
Sodium: 132 mmol/L — ABNORMAL LOW (ref 135–145)
Total Bilirubin: 0.8 mg/dL (ref 0.0–1.2)
Total Protein: 6.1 g/dL — ABNORMAL LOW (ref 6.5–8.1)

## 2023-06-29 LAB — CBC WITH DIFFERENTIAL/PLATELET
Abs Immature Granulocytes: 0.05 10*3/uL (ref 0.00–0.07)
Basophils Absolute: 0.1 10*3/uL (ref 0.0–0.1)
Basophils Relative: 1 %
Eosinophils Absolute: 0.3 10*3/uL (ref 0.0–0.5)
Eosinophils Relative: 3 %
HCT: 41.1 % (ref 39.0–52.0)
Hemoglobin: 14 g/dL (ref 13.0–17.0)
Immature Granulocytes: 1 %
Lymphocytes Relative: 25 %
Lymphs Abs: 2.3 10*3/uL (ref 0.7–4.0)
MCH: 31.6 pg (ref 26.0–34.0)
MCHC: 34.1 g/dL (ref 30.0–36.0)
MCV: 92.8 fL (ref 80.0–100.0)
Monocytes Absolute: 0.9 10*3/uL (ref 0.1–1.0)
Monocytes Relative: 9 %
Neutro Abs: 5.5 10*3/uL (ref 1.7–7.7)
Neutrophils Relative %: 61 %
Platelets: 311 10*3/uL (ref 150–400)
RBC: 4.43 MIL/uL (ref 4.22–5.81)
RDW: 13 % (ref 11.5–15.5)
WBC: 9 10*3/uL (ref 4.0–10.5)
nRBC: 0 % (ref 0.0–0.2)

## 2023-06-29 LAB — CBC
HCT: 40.9 % (ref 39.0–52.0)
Hemoglobin: 14.4 g/dL (ref 13.0–17.0)
MCH: 32.1 pg (ref 26.0–34.0)
MCHC: 35.2 g/dL (ref 30.0–36.0)
MCV: 91.3 fL (ref 80.0–100.0)
Platelets: 287 10*3/uL (ref 150–400)
RBC: 4.48 MIL/uL (ref 4.22–5.81)
RDW: 13.1 % (ref 11.5–15.5)
WBC: 10.6 10*3/uL — ABNORMAL HIGH (ref 4.0–10.5)
nRBC: 0 % (ref 0.0–0.2)

## 2023-06-29 LAB — RAPID URINE DRUG SCREEN, HOSP PERFORMED
Amphetamines: NOT DETECTED
Barbiturates: NOT DETECTED
Benzodiazepines: NOT DETECTED
Cocaine: NOT DETECTED
Opiates: NOT DETECTED
Tetrahydrocannabinol: POSITIVE — AB

## 2023-06-29 LAB — HIV ANTIBODY (ROUTINE TESTING W REFLEX): HIV Screen 4th Generation wRfx: NONREACTIVE

## 2023-06-29 LAB — URINALYSIS, W/ REFLEX TO CULTURE (INFECTION SUSPECTED)
Bacteria, UA: NONE SEEN
Bilirubin Urine: NEGATIVE
Glucose, UA: NEGATIVE mg/dL
Hgb urine dipstick: NEGATIVE
Ketones, ur: NEGATIVE mg/dL
Leukocytes,Ua: NEGATIVE
Nitrite: NEGATIVE
Protein, ur: NEGATIVE mg/dL
Specific Gravity, Urine: 1.035 — ABNORMAL HIGH (ref 1.005–1.030)
pH: 5 (ref 5.0–8.0)

## 2023-06-29 LAB — TROPONIN I (HIGH SENSITIVITY): Troponin I (High Sensitivity): 3 ng/L (ref <18)

## 2023-06-29 LAB — PROCALCITONIN: Procalcitonin: 2.86 ng/mL

## 2023-06-29 LAB — GLUCOSE, CAPILLARY
Glucose-Capillary: 137 mg/dL — ABNORMAL HIGH (ref 70–99)
Glucose-Capillary: 183 mg/dL — ABNORMAL HIGH (ref 70–99)
Glucose-Capillary: 85 mg/dL (ref 70–99)
Glucose-Capillary: 87 mg/dL (ref 70–99)

## 2023-06-29 LAB — LIPASE, BLOOD: Lipase: 28 U/L (ref 11–51)

## 2023-06-29 LAB — SEDIMENTATION RATE: Sed Rate: 2 mm/h (ref 0–16)

## 2023-06-29 LAB — LACTIC ACID, PLASMA: Lactic Acid, Venous: 1.6 mmol/L (ref 0.5–1.9)

## 2023-06-29 LAB — C-REACTIVE PROTEIN: CRP: 0.6 mg/dL (ref <1.0)

## 2023-06-29 MED ORDER — ONDANSETRON HCL 4 MG/2ML IJ SOLN: 4.0000 mg | Freq: Four times a day (QID) | INTRAMUSCULAR | Status: DC | PRN

## 2023-06-29 MED ORDER — SODIUM CHLORIDE 0.9% FLUSH
3.0000 mL | Freq: Two times a day (BID) | INTRAVENOUS | Status: DC
  Administered 2023-06-29 – 2023-06-30 (×4): 3 mL via INTRAVENOUS

## 2023-06-29 MED ORDER — DICYCLOMINE HCL 10 MG PO CAPS
10.0000 mg | ORAL_CAPSULE | Freq: Three times a day (TID) | ORAL | Status: DC
  Administered 2023-06-29 – 2023-06-30 (×4): 10 mg via ORAL
  Filled 2023-06-29 (×4): qty 1

## 2023-06-29 MED ORDER — ACETAMINOPHEN 325 MG PO TABS: 650.0000 mg | ORAL_TABLET | Freq: Four times a day (QID) | ORAL | Status: DC | PRN

## 2023-06-29 MED ORDER — ACETAMINOPHEN 650 MG RE SUPP: 650.0000 mg | Freq: Four times a day (QID) | RECTAL | Status: DC | PRN

## 2023-06-29 MED ORDER — OXYCODONE HCL 5 MG PO TABS
5.0000 mg | ORAL_TABLET | Freq: Four times a day (QID) | ORAL | Status: DC | PRN
  Administered 2023-06-29 – 2023-06-30 (×4): 5 mg via ORAL
  Filled 2023-06-29 (×4): qty 1

## 2023-06-29 MED ORDER — ACETAMINOPHEN 500 MG PO TABS
1000.0000 mg | ORAL_TABLET | Freq: Once | ORAL | Status: AC
  Administered 2023-06-29: 1000 mg via ORAL
  Filled 2023-06-29: qty 2

## 2023-06-29 MED ORDER — LORAZEPAM 2 MG/ML IJ SOLN
1.0000 mg | INTRAMUSCULAR | Status: DC | PRN
  Administered 2023-06-29: 1 mg via INTRAVENOUS
  Filled 2023-06-29: qty 1

## 2023-06-29 MED ORDER — SENNOSIDES-DOCUSATE SODIUM 8.6-50 MG PO TABS
1.0000 | ORAL_TABLET | Freq: Two times a day (BID) | ORAL | Status: DC
  Administered 2023-06-29 – 2023-06-30 (×3): 1 via ORAL
  Filled 2023-06-29 (×3): qty 1

## 2023-06-29 MED ORDER — HYDROXYZINE HCL 25 MG PO TABS: 25.0000 mg | ORAL_TABLET | Freq: Three times a day (TID) | ORAL | Status: DC | PRN

## 2023-06-29 MED ORDER — ADULT MULTIVITAMIN W/MINERALS CH
1.0000 | ORAL_TABLET | Freq: Every day | ORAL | Status: DC
  Administered 2023-06-29 – 2023-06-30 (×2): 1 via ORAL
  Filled 2023-06-29 (×2): qty 1

## 2023-06-29 MED ORDER — SERTRALINE HCL 50 MG PO TABS
50.0000 mg | ORAL_TABLET | Freq: Every day | ORAL | Status: DC
  Administered 2023-06-29 – 2023-06-30 (×2): 50 mg via ORAL
  Filled 2023-06-29 (×2): qty 1

## 2023-06-29 MED ORDER — ALBUTEROL SULFATE (2.5 MG/3ML) 0.083% IN NEBU: 2.5000 mg | INHALATION_SOLUTION | Freq: Four times a day (QID) | RESPIRATORY_TRACT | Status: DC | PRN

## 2023-06-29 MED ORDER — TRAZODONE HCL 50 MG PO TABS: 50.0000 mg | ORAL_TABLET | Freq: Every evening | ORAL | Status: DC | PRN

## 2023-06-29 MED ORDER — LORAZEPAM 1 MG PO TABS
1.0000 mg | ORAL_TABLET | ORAL | Status: DC | PRN
  Administered 2023-06-29: 1 mg via ORAL
  Filled 2023-06-29: qty 1

## 2023-06-29 MED ORDER — DEXTROSE 50 % IV SOLN: 1.0000 | INTRAVENOUS | Status: DC | PRN

## 2023-06-29 MED ORDER — ENOXAPARIN SODIUM 40 MG/0.4ML IJ SOSY
40.0000 mg | PREFILLED_SYRINGE | Freq: Every day | INTRAMUSCULAR | Status: DC
  Administered 2023-06-29 – 2023-06-30 (×2): 40 mg via SUBCUTANEOUS
  Filled 2023-06-29 (×2): qty 0.4

## 2023-06-29 MED ORDER — HYDROMORPHONE HCL 1 MG/ML IJ SOLN
0.5000 mg | Freq: Once | INTRAMUSCULAR | Status: AC
  Administered 2023-06-29: 0.5 mg via INTRAVENOUS
  Filled 2023-06-29: qty 0.5

## 2023-06-29 MED ORDER — FOLIC ACID 1 MG PO TABS
1.0000 mg | ORAL_TABLET | Freq: Every day | ORAL | Status: DC
  Administered 2023-06-29 – 2023-06-30 (×2): 1 mg via ORAL
  Filled 2023-06-29 (×2): qty 1

## 2023-06-29 MED ORDER — POLYETHYLENE GLYCOL 3350 17 G PO PACK
17.0000 g | PACK | Freq: Every day | ORAL | Status: DC
  Administered 2023-06-29 – 2023-06-30 (×2): 17 g via ORAL
  Filled 2023-06-29 (×2): qty 1

## 2023-06-29 MED ORDER — NICOTINE 21 MG/24HR TD PT24
21.0000 mg | MEDICATED_PATCH | Freq: Every day | TRANSDERMAL | Status: DC
  Administered 2023-06-29 – 2023-06-30 (×3): 21 mg via TRANSDERMAL
  Filled 2023-06-29 (×4): qty 1

## 2023-06-29 MED ORDER — ONDANSETRON HCL 4 MG PO TABS: 4.0000 mg | ORAL_TABLET | Freq: Four times a day (QID) | ORAL | Status: DC | PRN

## 2023-06-29 NOTE — Progress Notes (Signed)
 [facility] PLAN OF CARE NOTE Patient: Nathan Escobar ZOX:[id number]   PCP: Patient, No Pcp Per DOB: 08/30/1985   DOA: 06/28/2023   DOS: 06/29/2023    Patient was admitted by my colleague earlier on 06/29/2023. I have reviewed the H&P as well as assessment and plan and agree with the same. Important changes in the plan are listed below.  Plan of care: Principal Problem:   SIRS (systemic inflammatory response syndrome) (HCC) Active Problems:   Suicidal ideations   Mood disorder (HCC)   Hypoglycemia   Alcohol abuse with intoxication (HCC)   Elevated troponin   Polycythemia   Polysubstance abuse (HCC)  SIRS. Tachycardia. Tachypnea. Symptoms have resolved with hydration. Monitor for now.  Patient has leukocytosis at the time of admission. Procalcitonin level mildly elevated as well. Chest x-ray negative. Currently no evidence of active infection. Will monitor clinically without any antibiotic right now.  Abdominal pain. Patient reports that he has diffuse abdominal pain. Pain actually is occurring even on auscultation with a stethoscope as well as very light touch of his abdomen. Lipase level have been checked which is negative. Lactic acid is also improving with hydration. At present etiology is less likely organic. Will get an x-ray abdomen to rule out any acute pathology. Recheck lipase level.  LFT elevation. The setting of alcohol abuse. For now we will monitor.  Already improving.  Elevated troponin. Troponin level is 22.  Recheck is 3. Etiology is not clear.  Currently chest pain-free.  No respiratory symptoms as well. EKG shows LVH. Suspect demand ischemia in the setting of hemodynamic imbalance on the time of admission.  Alcohol abuse. Drinks a case of beer on a daily basis. Lately also has been drinking 2 1/5 of liquor. Currently on CIWA protocol. At risk for withdrawal.  May require Librium protocol.  Level of care: Telemetry Medical  Author: Charlean Congress, MD  [facility] 06/29/2023 1:27 PM   If 7PM-7AM, please contact night-coverage at [email or web address]

## 2023-06-29 NOTE — TOC CM/SW Note (Signed)
 Transition of Care [facility]) - Inpatient Brief Assessment   Patient Details  Name: Nathan Escobar MRN: [id number] Date of Birth: 03-03-1985  [facility] of [facility] [place] [facility] CM/SW Contact:    Tom-Johnson, Angelique Ken, RN Phone Number: 06/29/2023, 1:33 PM   Clinical Narrative:  Patient presented to the ED form a Mental Health Facility with Suicidal Ideation without a plan requesting Detox. Admitted with SIRS. On CIWA protocol.  Has hx of Polysubstance abuse, Bipolar Disorder, MDD and PTSD.  Patient is currently IVC'd, awaiting Psych eval/assessment.    Patient resting quietly and sitter at bedside.  Patient not Medically ready for discharge.  CM will continue to follow as patient progresses with care towards discharge.             Transition of Care Asessment:

## 2023-06-29 NOTE — BH Assessment (Signed)
 Clinician message secure message to the pt's team, "pt is on medical floor can you discontinue the TTS consult and request psych consult." Per chart pt has been moved to medical floor.   Clinician awaiting response.    Rosi Converse, MS, [facility], [facility] Triage Specialist [phone]

## 2023-06-29 NOTE — BH Assessment (Signed)
 TTS consult will be completed by IRIS. IRIS Coordinator will communicate in established secure chat assessment time and provider name. Thanks
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
## 2023-06-29 NOTE — ED Notes (Signed)
 Envelope #[id number] has been submitted successfully. Original in red folder 3 copies sent up stairs on the floor with patient on 46M. 1 copy in medical records

## 2023-06-29 NOTE — Consult Note (Signed)
 [facility] Psychiatric Consult Initial  Patient Name: .[name]  MRN: [id number]  DOB: [birth year]  Consult Order details:  Orders (From admission, onward)     Start     Ordered   06/29/23 1115  IP CONSULT TO PSYCHIATRY       Ordering Provider: Kraig Peru, MD  Provider:  (Not yet assigned)  Question Answer Comment  Location [facility]   Reason for Consult? suicidal ideation without plan      06/29/23 1115             Mode of Visit: In person    Psychiatry Consult Evaluation  Service Date: Jun 29, 2023 LOS:  LOS: 0 days  Chief Complaint wanting detox  Primary Psychiatric Diagnoses  Alcohol use disorder 2.  Marijuana use disorder 3.  MDD vs substance induced mood disorder  Assessment  Nathan Escobar is a 38 y.o. male admitted: Medicallyfor 06/28/2023  5:31 PM for mental health crisis and substance use relapse . He carries the psychiatric diagnoses of MDD and opioid use disorder and has a past medical history of  hepatitis C.   His current presentation of worsening depression in the setting of recent relapse of alcohol and marijuana is most consistent with alcohol use disorder and marijuana use disorder. Current outpatient psychotropic medications include Prozac , Seroquel , and gabapentin  and historically he has had a fair response to these medications. He was not compliant with medications prior to admission as evidenced by self report. Patient is a good candidate for Suboxone; however, patient is not consistently taking the medication. Patient reports adverse effect of emotional blunting on Prozac  although this is not a common adverse effect and may be more likely due to his Seroquel . As he is not currently psychotic, we will hold Seroquel  at this time and closely monitor. We will start on Zoloft to aid with depression but his symptoms are likely also very closely tied to his substance use in which he is currently in the precontemplative phase. On initial  examination, patient appears depressed. While he is reporting active SI, he contracts for safety and patient has not has suicide attempts for 2 years. We will closely monitor and patient may mostly benefit from going to an inpatient rehabilitation facility for substance use.  Although patient is requesting to go to a long-term rehab facility, this is likely not feasible to be arranged during his hospitalization.  Will communicate with TOC regarding his preference of going to an inpatient rehabilitation program.  Please see plan below for detailed recommendations.   Diagnoses:  Active Hospital problems: Principal Problem:   SIRS (systemic inflammatory response syndrome) (HCC) Active Problems:   Polysubstance abuse (HCC)   Suicidal ideations   Mood disorder (HCC)   Hypoglycemia   Alcohol abuse with intoxication (HCC)   Elevated troponin   Polycythemia    Plan   ## Psychiatric Medication Recommendations:  -Start Zoloft 50 mg daily for depression -Hydroxyzine  25 mg TID PRN for anxiety -Trazodone  50 mg qhs PRN for sleep  ## Medical Decision Making Capacity: Not specifically addressed in this encounter  ## Further Work-up:  TOC consult for substance abuse resources -- most recent EKG on 5/28 had QtC of 433 -- Pertinent labwork reviewed earlier this admission includes: glucose 183, EtOH 150, positive THC   ## Disposition:-- Recommend inpatient rehabilitation program  ## Behavioral / Environmental: -Utilize compassion and acknowledge the patient's experiences while setting clear and realistic expectations for care.    ## Safety and  Observation Level:  - Based on my clinical evaluation, I estimate the patient to be at moderate risk of self harm in the current setting. - At this time, we recommend  1:1 Observation. This decision is based on my review of the chart including patient's history and current presentation, interview of the patient, mental status examination, and consideration of  suicide risk including evaluating suicidal ideation, plan, intent, suicidal or self-harm behaviors, risk factors, and protective factors. This judgment is based on our ability to directly address suicide risk, implement suicide prevention strategies, and develop a safety plan while the patient is in the clinical setting. Please contact our team if there is a concern that risk level has changed.  CSSR Risk Category:C-SSRS RISK CATEGORY: High Risk  Suicide Risk Assessment: Patient has following modifiable risk factors for suicide: active suicidal ideation, under treated depression , recklessness, and medication noncompliance, which we are addressing by medication optimization and TOC consult for substance use. Patient has following non-modifiable or demographic risk factors for suicide: male gender, history of suicide attempt, history of self harm behavior, and psychiatric hospitalization Patient has the following protective factors against suicide: Supportive family and Pets in the home  Thank you for this consult request. Recommendations have been communicated to the primary team.  We will continue to follow at this time.   Joice Nares, MD       History of Present Illness  Relevant Aspects of Hospital Course:  Admitted on 06/28/2023 for mental health crisis and substance use relapse . Patient had been given acetaminophen  1000 mg p.o., amp of D50, 2 L of lactated Ringer's, Dilaudid IV, Ativan IV, Zofran , Protonix, vancomycin, metronidazole, and cefepime.   Patient Report:  Patient seen laying in his bed, no acute distress.  He reports that since he was hospitalized in March of this year, he was not able to pick up his medications and states this is due to a financial barrier.  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  He reports drinking  either 1 case of beer or half a gallon of liquor daily and has been drinking since he was 38 years old.  He also has been smoking marijuana daily since he was 38 years old.  Patient became upset towards his grandmother because she called him a drunk and he thought that she was going to IVC him so he barricaded himself in the house in which she called his [facility] peer support member who calm him down.  He is also upset at his uncle because his uncle was kicking the cats and kittens in the house.  He has HI towards his uncle due to this.  He currently reports active SI without a plan or intent.  He contracts for safety.  He reports difficulty sleeping at his grandmother's house due to bedbugs.  He reports feelings of hopelessness, poor energy and concentration, and weight loss due to difficulty obtaining food.  He denies AVH.   Collateral information:  Patient gave permission for us  to contact his [employer] peer support member at [phone]  Review of Systems  Constitutional:  Positive for chills, malaise/fatigue and weight loss.  Cardiovascular:  Negative for chest pain.  Gastrointestinal:  Negative for nausea.  Neurological:  Negative for headaches.     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 Legal Hx: Has been to jail multiple times and is currently on parole Living Situation: Currently living with his grandmother, 3 uncles, and a friend in [place] Access to weapons/lethal means:  Denies  Substance History Alcohol: Reports drinking since he was 38 years old, 1 case of beer-half a gallon of liquor daily, most recently prior to admission History of alcohol withdrawal seizures: yes History of DT's: Unclear, he did say he was previously hospitalized due to alcohol withdrawal Tobacco: Smokes 2 packs daily Illicit drugs: Previously used heroin but no longer Rehab hx: Yes, years ago, stated it helped  Exam Findings  Vital Signs:  Temp:  [97.5 F (36.4 C)-98.2 F (36.8 C)] 97.5 F (36.4 C) (05/28 0813) Pulse Rate:  [57-114] 57 (05/28 0813) Resp:  [16-28] 18 (05/28 0813) BP: (96-122)/(54-76) 103/76 (05/28 0813) SpO2:  [93 %-99 %] 98 % (05/28 0813) Weight:  [63.6 kg] 63.6 kg (05/28 0342) Blood pressure 103/76, pulse (!) 57, temperature (!) 97.5 F (36.4 C), temperature source Oral, resp. rate 18, height 5\' 3"  (1.6 m), weight 63.6 kg, SpO2 98%. Body mass index is 24.84 kg/m.  Physical Exam Vitals reviewed.  Constitutional:      Appearance: Normal appearance.  HENT:     Head: Normocephalic and atraumatic.  Cardiovascular:     Rate and Rhythm: Normal rate.  Pulmonary:     Effort: Pulmonary effort is normal.  Neurological:     General: No focal deficit present.     Mental Status: He is alert.     Mental Status Exam: General Appearance: disheveled  Orientation:  Full (Time, Place, and Person)  Memory:  Remote;   Fair  Concentration:  Concentration: Good  Recall:  Fair  Attention  Good  Eye Contact:  Fair  Speech:  Normal Rate  Language:  Good  Volume:  Normal  Mood: depressed  Affect:  Congruent  Thought Process:  Coherent, Goal Directed, and Linear  Thought Content:  Logical  Suicidal Thoughts:  Yes.  without intent/plan  Homicidal Thoughts:  Yes.  without intent/plan  Judgement:  Impaired  Insight:  Fair  Psychomotor Activity:  Normal  Akathisia:  No  Fund of Knowledge:  Fair      Assets:  Manufacturing systems engineer Desire for  Improvement Physical Health Social Support  Cognition:  WNL  ADL's:  Intact  AIMS (if indicated):        Other History   These have been pulled in through the EMR, reviewed, and updated if appropriate.  Family History:  The patient's family history includes Bipolar disorder in his father and paternal aunt; Depression in his mother; Drug abuse in his father; Suicidality in his paternal aunt.  Medical History: Past Medical History:  Diagnosis Date   Anxiety    Depression    Hepatitis C    Heroin abuse (HCC)    IVDU (intravenous drug user)    Marijuana use    Methamphetamine abuse (HCC)    PTSD (post-traumatic stress disorder)     Surgical History: History reviewed. No pertinent surgical history.  Medications:   Current Facility-Administered Medications:    acetaminophen  (TYLENOL ) tablet 650 mg, 650 mg, Oral, Q6H PRN **OR** acetaminophen  (TYLENOL ) suppository 650 mg, 650 mg, Rectal, Q6H PRN, Patel, Pranav M, MD   albuterol  (PROVENTIL ) (2.5 MG/3ML) 0.083% nebulizer solution 2.5 mg, 2.5 mg, Nebulization, Q6H PRN, Felipe Horton, Rondell A, MD   dextrose  50 % solution 50 mL, 1 ampule, Intravenous, PRN, Felipe Horton, Rondell A, MD   enoxaparin  (LOVENOX ) injection 40 mg, 40 mg, Subcutaneous, Daily, Felipe Horton, Rondell A, MD, 40 mg at 06/29/23 1119   folic acid  (FOLVITE ) tablet 1 mg, 1 mg, Oral, Daily, Smith, Rondell A, MD, 1 mg at 06/29/23 1120   LORazepam  (ATIVAN ) injection 0-4 mg, 0-4 mg, Intravenous, Q6H **OR** LORazepam  (ATIVAN ) tablet 0-4 mg, 0-4 mg, Oral, Q6H, Verdia Glad, John K, PA-C, 1 mg at 06/29/23 1224   [START ON 07/01/2023] LORazepam  (ATIVAN ) injection 0-4 mg, 0-4 mg, Intravenous, Q12H **OR** [START ON 07/01/2023] LORazepam  (ATIVAN ) tablet 0-4 mg, 0-4 mg, Oral, Q12H, Verdia Glad, John K, PA-C   LORazepam  (ATIVAN ) tablet 1-4 mg, 1-4 mg, Oral, Q1H PRN, 1 mg at 06/29/23 0429 **OR** LORazepam  (ATIVAN ) injection 1-4 mg, 1-4 mg, Intravenous, Q1H PRN, Lena Qualia, MD   multivitamin with minerals  tablet 1 tablet, 1 tablet, Oral, Daily, Felipe Horton, Rondell A, MD, 1 tablet at 06/29/23 1120   nicotine  (NICODERM CQ  - dosed in mg/24 hours) patch 21 mg, 21 mg, Transdermal, Daily, Iva Mariner, MD, 21 mg at 06/29/23 1121   ondansetron  (ZOFRAN ) tablet 4 mg, 4 mg, Oral, Q6H PRN **OR** ondansetron  (ZOFRAN ) injection 4 mg, 4 mg, Intravenous, Q6H PRN, Felipe Horton, Rondell A, MD   oxyCODONE  (Oxy IR/ROXICODONE ) immediate release tablet 5 mg, 5 mg, Oral, Q6H PRN, Patel, Pranav M, MD, 5 mg at 06/29/23 1610   sodium chloride  flush (NS) 0.9 % injection 3 mL, 3 mL, Intravenous, Q12H, Smith, Rondell A, MD, 3 mL at 06/29/23 1124   thiamine  (VITAMIN B1) tablet 100 mg, 100 mg, Oral, Daily, 100 mg at 06/29/23 1120 **OR** thiamine  (VITAMIN B1) injection 100 mg, 100 mg, Intravenous, Daily, Robinson, John K, PA-C  Allergies: Allergies  Allergen Reactions   Fish Allergy Anaphylaxis   Shellfish Allergy Anaphylaxis   Tramadol Hives   Ketorolac  Nausea And Vomiting and Hives    Saratha Cunas, MD PGY-2 Psychiatry  This note was created using a voice recognition software as a result there may be grammatical errors inadvertently enclosed that do not reflect the nature of this encounter. Every attempt is made to correct such errors.

## 2023-06-29 NOTE — ED Notes (Signed)
 New filling , per magistrate original envelope number did not show up   Envelope #[id number] has been submitted successfully.

## 2023-06-29 NOTE — ED Provider Notes (Signed)
 Patient presented to the ED with suicidal thoughts.  Also was complaining of subjective fevers and chills.  States that he has been drinking alcohol and using marijuana.  Emergency department workup notable for a leukocytosis to 27, lactic acidosis to 3.5, elevated troponin.  Patient was started on broad-spectrum antibiotics and sepsis protocol.  He has received IV fluid.  His lactic is downtrending.  Troponin has also improved.  Patient denies IV drug use.  Given that he meets SIRS criteria, but no source identified, I do not think that he is medically clear for psychiatric evaluation.  I discussed the case with Dr. Carol Chroman, who agrees with plan for medical admission for SIRS.  Appreciate Dr. Felipe Horton, hospitalist for admitting the patient.  I will place the patient under IVC for suicidal thoughts.   Sherel Dikes, PA-C 06/29/23 0200    Ballard Bongo, MD 06/29/23 [phone]

## 2023-06-29 NOTE — ED Notes (Addendum)
 Case number [id number], patient went up stairs in the middle of being iVC, 3 copies has been given to [facility] room 7

## 2023-06-29 NOTE — Plan of Care (Signed)

## 2023-06-29 NOTE — Progress Notes (Signed)
 New Admission Note:    Arrival Method: ED stretcher Mental Orientation: AAOx4 Telemetry: [phone] Assessment: Completed Skin: See flowsheet IV: RUE&LUE Pain: 8/10 Tubes: n/a Safety Measures: Safety Fall Prevention Plan has been given, discussed and signed Admission: Completed [facility] Orientation: Patient has been orientated to the room, unit and staff.  Family: none at bedside SI sitter at bedside, SI precautions placed   Orders have been reviewed and implemented. Will continue to monitor the patient. Call light has been placed within reach and bed alarm has been activated.

## 2023-06-29 NOTE — ED Notes (Signed)
 Original copy found in [facility], placed in red folder.

## 2023-06-30 DIAGNOSIS — R7401 Elevation of levels of liver transaminase levels: Secondary | ICD-10-CM | POA: Diagnosis not present

## 2023-06-30 DIAGNOSIS — R7989 Other specified abnormal findings of blood chemistry: Secondary | ICD-10-CM | POA: Diagnosis not present

## 2023-06-30 DIAGNOSIS — F1010 Alcohol abuse, uncomplicated: Secondary | ICD-10-CM

## 2023-06-30 DIAGNOSIS — R6510 Systemic inflammatory response syndrome (SIRS) of non-infectious origin without acute organ dysfunction: Secondary | ICD-10-CM | POA: Diagnosis not present

## 2023-06-30 LAB — COMPREHENSIVE METABOLIC PANEL WITH GFR
ALT: 124 U/L — ABNORMAL HIGH (ref 0–44)
AST: 129 U/L — ABNORMAL HIGH (ref 15–41)
Albumin: 3.6 g/dL (ref 3.5–5.0)
Alkaline Phosphatase: 49 U/L (ref 38–126)
Anion gap: 6 (ref 5–15)
BUN: 8 mg/dL (ref 6–20)
CO2: 27 mmol/L (ref 22–32)
Calcium: 8.9 mg/dL (ref 8.9–10.3)
Chloride: 104 mmol/L (ref 98–111)
Creatinine, Ser: 0.91 mg/dL (ref 0.61–1.24)
GFR, Estimated: >60 mL/min (ref >60)
Glucose, Bld: 93 mg/dL (ref 70–99)
Potassium: 4 mmol/L (ref 3.5–5.1)
Sodium: 137 mmol/L (ref 135–145)
Total Bilirubin: 0.8 mg/dL (ref 0.0–1.2)
Total Protein: 6.9 g/dL (ref 6.5–8.1)

## 2023-06-30 LAB — PROCALCITONIN: Procalcitonin: 2.01 ng/mL

## 2023-06-30 LAB — MAGNESIUM: Magnesium: 1.8 mg/dL (ref 1.7–2.4)

## 2023-06-30 LAB — GLUCOSE, CAPILLARY
Glucose-Capillary: 91 mg/dL (ref 70–99)
Glucose-Capillary: 92 mg/dL (ref 70–99)
Glucose-Capillary: 97 mg/dL (ref 70–99)

## 2023-06-30 MED ORDER — MELATONIN 5 MG PO TABS: 5.0000 mg | ORAL_TABLET | Freq: Every day | ORAL | 0 refills | Status: AC

## 2023-06-30 MED ORDER — DICYCLOMINE HCL 10 MG PO CAPS: 10.0000 mg | ORAL_CAPSULE | Freq: Three times a day (TID) | ORAL | 0 refills | Status: AC

## 2023-06-30 MED ORDER — LORAZEPAM 1 MG PO TABS: 1.0000 mg | ORAL_TABLET | ORAL | Status: DC | PRN

## 2023-06-30 MED ORDER — CHLORDIAZEPOXIDE HCL 10 MG PO CAPS: ORAL_CAPSULE | ORAL | 0 refills | Status: AC

## 2023-06-30 MED ORDER — LORAZEPAM 2 MG/ML IJ SOLN: 1.0000 mg | INTRAMUSCULAR | Status: DC | PRN

## 2023-06-30 MED ORDER — NICOTINE 21 MG/24HR TD PT24
21.0000 mg | MEDICATED_PATCH | Freq: Every day | TRANSDERMAL | 0 refills | Status: AC
Start: 2023-07-01 — End: ?

## 2023-06-30 MED ORDER — SERTRALINE HCL 50 MG PO TABS: 50.0000 mg | ORAL_TABLET | Freq: Every day | ORAL | 0 refills | Status: AC

## 2023-06-30 MED ORDER — GABAPENTIN 400 MG PO CAPS: 400.0000 mg | ORAL_CAPSULE | Freq: Three times a day (TID) | ORAL | 0 refills | Status: AC

## 2023-06-30 MED ORDER — POLYETHYLENE GLYCOL 3350 17 G PO PACK: 17.0000 g | PACK | Freq: Every day | ORAL | 0 refills | Status: AC

## 2023-06-30 MED ORDER — FOLIC ACID 1 MG PO TABS: 1.0000 mg | ORAL_TABLET | Freq: Every day | ORAL | Status: DC

## 2023-06-30 MED ORDER — ADULT MULTIVITAMIN W/MINERALS CH: 1.0000 | ORAL_TABLET | Freq: Every day | ORAL | 0 refills | Status: AC

## 2023-06-30 MED ORDER — FOLIC ACID 1 MG PO TABS: 1.0000 mg | ORAL_TABLET | Freq: Every day | ORAL | 0 refills | Status: AC

## 2023-06-30 MED ORDER — VITAMIN B-1 100 MG PO TABS: 100.0000 mg | ORAL_TABLET | Freq: Every day | ORAL | 0 refills | Status: AC

## 2023-06-30 MED ORDER — GABAPENTIN 300 MG PO CAPS
300.0000 mg | ORAL_CAPSULE | Freq: Three times a day (TID) | ORAL | Status: DC
  Administered 2023-06-30: 300 mg via ORAL
  Filled 2023-06-30: qty 1

## 2023-06-30 MED ORDER — THIAMINE MONONITRATE 100 MG PO TABS: 100.0000 mg | ORAL_TABLET | Freq: Every day | ORAL | Status: DC

## 2023-06-30 MED ORDER — DICYCLOMINE HCL 10 MG PO CAPS: 10.0000 mg | ORAL_CAPSULE | Freq: Three times a day (TID) | ORAL | 0 refills | Status: DC

## 2023-06-30 MED ORDER — LORAZEPAM 1 MG PO TABS: 0.0000 mg | ORAL_TABLET | Freq: Two times a day (BID) | ORAL | Status: DC

## 2023-06-30 MED ORDER — THIAMINE HCL 100 MG/ML IJ SOLN: 100.0000 mg | Freq: Every day | INTRAMUSCULAR | Status: DC

## 2023-06-30 MED ORDER — MELATONIN 5 MG PO TABS: 5.0000 mg | ORAL_TABLET | Freq: Every day | ORAL | Status: DC

## 2023-06-30 MED ORDER — ADULT MULTIVITAMIN W/MINERALS CH: 1.0000 | ORAL_TABLET | Freq: Every day | ORAL | Status: DC

## 2023-06-30 MED ORDER — LORAZEPAM 1 MG PO TABS: 0.0000 mg | ORAL_TABLET | Freq: Four times a day (QID) | ORAL | Status: DC

## 2023-06-30 MED ORDER — HYDROXYZINE HCL 25 MG PO TABS: 25.0000 mg | ORAL_TABLET | Freq: Three times a day (TID) | ORAL | 0 refills | Status: AC | PRN

## 2023-06-30 NOTE — Plan of Care (Signed)

## 2023-06-30 NOTE — Consult Note (Signed)
 Nathan Escobar Follow Up  Patient Name: .Nathan Escobar  MRN: [id number]  DOB: 1985/12/27  Escobar Order details:  Orders (From admission, onward)     Start     Ordered   06/29/23 1115  IP Escobar TO PSYCHIATRY       Ordering Provider: Kraig Peru, MD  Provider:  (Not yet assigned)  Question Answer Comment  Location [facility]   Reason for Escobar? suicidal ideation without plan      06/29/23 1115             Mode of Visit: In person    Psychiatry Escobar Evaluation  Service Date: Jun 30, 2023 LOS:  LOS: 0 days  Chief Complaint wanting detox  Primary Psychiatric Diagnoses  Alcohol use disorder 2.  Marijuana use disorder 3.  MDD vs substance induced mood disorder  Assessment  Nathan Escobar is a 38 y.o. male admitted: Medicallyfor 06/28/2023  5:31 PM for mental health crisis and substance use relapse . He carries the psychiatric diagnoses of MDD and opioid use disorder and has a past medical history of  hepatitis C.   His current presentation of worsening depression in the setting of recent relapse of alcohol and marijuana is most consistent with alcohol use disorder and marijuana use disorder. Current outpatient psychotropic medications include Prozac , Seroquel , and gabapentin  and historically he has had a fair response to these medications. He was not compliant with medications prior to admission as evidenced by self report. Patient is a good candidate for Suboxone; however, patient is not consistently taking the medication. Patient reports adverse effect of emotional blunting on Prozac  although this is not a common adverse effect and may be more likely due to his Seroquel . As he is not currently psychotic, we will hold Seroquel  at this time and closely monitor. We will start on Zoloft  to aid with depression but his symptoms are likely also very closely tied to his substance use in which he is currently in the precontemplative phase. On initial  examination, patient appears depressed. While he is reporting active SI, he contracts for safety and patient has not has suicide attempts for 2 years. We will closely monitor and patient may mostly benefit from going to an inpatient rehabilitation facility for substance use.  Although patient is requesting to go to a long-term rehab facility, this is likely not feasible to be arranged during his hospitalization.  Will communicate with TOC regarding his preference of going to an inpatient rehabilitation program.  Please see plan below for detailed recommendations.   06/30/23 Patient is calm and cooperative this morning.  He requests his home gabapentin  be restarted due to his back pain.  We we will restart this medication at a lower dose as he has been off for about a month.  Patient is also willing to stay in the hospital and denies SI and contracts for safety.  Patient does not meet criteria for involuntary commitment and we will rescind the IVC.  Patient feels motivated to pursue inpatient rehabilitation treatment once he is discharged from this hospital.  Diagnoses:  Active Hospital problems: Principal Problem:   SIRS (systemic inflammatory response syndrome) (HCC) Active Problems:   Polysubstance abuse (HCC)   Suicidal ideations   Mood disorder (HCC)   Hypoglycemia   Alcohol abuse with intoxication (HCC)   Elevated troponin   Polycythemia    Plan   ## Psychiatric Medication Recommendations:  -Continue Zoloft  50 mg daily for depression -Restart home gabapentin  300  mg TID for neuropathic pain -Hydroxyzine  25 mg TID PRN for anxiety -Trazodone  50 mg qhs PRN for sleep  ## Medical Decision Making Capacity: Not specifically addressed in this encounter  ## Further Work-up:  TOC Escobar for substance abuse resources -- most recent EKG on 5/28 had QtC of 433 -- Pertinent labwork reviewed earlier this admission includes: glucose 183, EtOH 150, positive THC   ## Disposition:-- Recommend  inpatient rehabilitation program - We will rescind patient's IVC as he is wanting to stay voluntarily.  ## Behavioral / Environmental: -Utilize compassion and acknowledge the patient's experiences while setting clear and realistic expectations for care.    ## Safety and Observation Level:  - Based on my clinical evaluation, I estimate the patient to be at moderate risk of self harm in the current setting. - At this time, we recommend  routine observation This decision is based on my review of the chart including patient's history and current presentation, interview of the patient, mental status examination, and consideration of suicide risk including evaluating suicidal ideation, plan, intent, suicidal or self-harm behaviors, risk factors, and protective factors. This judgment is based on our ability to directly address suicide risk, implement suicide prevention strategies, and develop a safety plan while the patient is in the clinical setting. Please contact our team if there is a concern that risk level has changed.  CSSR Risk Category:C-SSRS RISK CATEGORY: High Risk  Suicide Risk Assessment: Patient has following modifiable risk factors for suicide: active suicidal ideation, under treated depression , recklessness, and medication noncompliance, which we are addressing by medication optimization and TOC Escobar for substance use. Patient has following non-modifiable or demographic risk factors for suicide: male gender, history of suicide attempt, history of self harm behavior, and psychiatric hospitalization Patient has the following protective factors against suicide: Supportive family and Pets in the home  Thank you for this Escobar request. Recommendations have been communicated to the primary team.  We will continue to follow at this time.   Joice Nares, MD       History of Present Illness  Relevant Aspects of Hospital Course:  Admitted on 06/28/2023 for mental health crisis and  substance use relapse . Patient had been given acetaminophen  1000 mg p.o., amp of D50, 2 L of lactated Ringer's, Dilaudid IV, Ativan IV, Zofran , Protonix, vancomycin, metronidazole, and cefepime.   Patient Report:  Patient seen laying in bed, no acute distress.  He reports feeling good this morning regarding his mood.  He reports sleeping well and slowly improving appetite.  He denies adverse effects from his medication.  He reports that he spoke with his significant other on the phone and he states that the conversation went well.  He reports that she wants him to stop drinking alcohol in which she states he is motivated.  He denies SI, HI, AVH.  He contracts for safety and states that he wants to stay in the hospital to get medically stabilized and he plans on calling [facility] to be admitted in the rehabilitation facility.   Collateral information:  Patient gave permission for us  to contact his [facility] peer support member at [phone]  Review of Systems  Constitutional:  Positive for chills, malaise/fatigue and weight loss.  Cardiovascular:  Negative for chest pain.  Gastrointestinal:  Negative for nausea.  Neurological:  Negative for headaches.     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 Legal Hx: Has been to jail multiple times and is currently on parole Living Situation: Currently living with his grandmother, 3 uncles, and a friend in [place] Access to weapons/lethal means:  Denies  Substance History Alcohol: Reports drinking since he was 38 years old, 1 case of beer-half a gallon of liquor daily, most recently prior to admission History of alcohol withdrawal seizures: yes History of DT's: Unclear, he did say he was previously hospitalized due to alcohol withdrawal Tobacco: Smokes 2 packs daily Illicit drugs: Previously used heroin but no longer Rehab hx: Yes, years ago, stated it helped  Exam Findings  Vital Signs:  Temp:  [97.7 F (36.5 C)-98.4 F (36.9 C)] 97.7 F (36.5 C) (05/29 0810) Pulse Rate:  [52-90] 60 (05/29 0810) Resp:  [15-21] 19 (05/29 0628) BP: (102-120)/(71-89) 120/76 (05/29 0810) SpO2:  [95 %-99 %] 98 % (05/29 0810) Blood pressure 120/76, pulse 60, temperature 97.7 F (36.5 C), temperature source Oral, resp. rate 19, height 5\' 3"  (1.6 m), weight 63.6 kg, SpO2 98%. Body mass index is 24.84 kg/m.  Physical Exam Vitals reviewed.  Constitutional:      Appearance: Normal appearance.  HENT:     Head: Normocephalic and atraumatic.  Cardiovascular:     Rate and Rhythm: Normal rate.  Pulmonary:     Effort: Pulmonary effort is normal.  Neurological:     General: No focal deficit present.     Mental Status: He is alert.     Mental Status Exam: General Appearance: disheveled  Orientation:  Full (Time, Place, and Person)  Memory:  Remote;   Fair  Concentration:  Concentration: Good  Recall:  Fair  Attention  Good  Eye Contact:  Fair  Speech:  Normal Rate  Language:  Good  Volume:  Normal  Mood: depressed  Affect:  Congruent  Thought Process:  Coherent, Goal Directed, and Linear  Thought Content:  Logical  Suicidal Thoughts:  Yes.  without intent/plan  Homicidal Thoughts:  Yes.  without intent/plan  Judgement:  Impaired  Insight:  Fair  Psychomotor Activity:  Normal  Akathisia:  No  Fund of Knowledge:  Fair      Assets:  Manufacturing systems engineer Desire for Improvement Physical Health Social Support  Cognition:  WNL   ADL's:  Intact  AIMS (if indicated):        Other History   These have been pulled in through the EMR, reviewed, and updated if appropriate.  Family History:  The patient's family history includes Bipolar disorder in his father and paternal aunt; Depression in his mother; Drug abuse in his father; Suicidality in his paternal aunt.  Medical History: Past Medical History:  Diagnosis Date   Anxiety    Depression    Hepatitis C    Heroin abuse (HCC)    IVDU (intravenous drug user)    Marijuana use    Methamphetamine abuse (HCC)    PTSD (post-traumatic stress disorder)     Surgical History: History reviewed. No pertinent surgical history.   Medications:   Current Facility-Administered Medications:    acetaminophen  (TYLENOL ) tablet 650 mg, 650 mg, Oral, Q6H PRN **OR** acetaminophen  (TYLENOL ) suppository 650  mg, 650 mg, Rectal, Q6H PRN, Patel, Pranav M, MD   albuterol (PROVENTIL) (2.5 MG/3ML) 0.083% nebulizer solution 2.5 mg, 2.5 mg, Nebulization, Q6H PRN, Smith, Rondell A, MD   dextrose 50 % solution 50 mL, 1 ampule, Intravenous, PRN, Felipe Horton, Rondell A, MD   dicyclomine  (BENTYL ) capsule 10 mg, 10 mg, Oral, TID AC & HS, Patel, Pranav M, MD, 10 mg at 06/30/23 8469   enoxaparin (LOVENOX) injection 40 mg, 40 mg, Subcutaneous, Daily, Felipe Horton, Rondell A, MD, 40 mg at 06/30/23 6295   folic acid (FOLVITE) tablet 1 mg, 1 mg, Oral, Daily, Felipe Horton, Rondell A, MD, 1 mg at 06/30/23 2841   gabapentin  (NEURONTIN ) capsule 300 mg, 300 mg, Oral, TID, Jibril Mcminn B, MD   hydrOXYzine  (ATARAX ) tablet 25 mg, 25 mg, Oral, TID PRN, Para Cossey B, MD   LORazepam (ATIVAN) injection 0-4 mg, 0-4 mg, Intravenous, Q6H **OR** LORazepam (ATIVAN) tablet 0-4 mg, 0-4 mg, Oral, Q6H, Verdia Glad, John K, PA-C, 1 mg at 06/29/23 1224   [START ON 07/01/2023] LORazepam (ATIVAN) injection 0-4 mg, 0-4 mg, Intravenous, Q12H **OR** [START ON 07/01/2023] LORazepam (ATIVAN) tablet 0-4 mg, 0-4 mg, Oral, Q12H, Verdia Glad, John K, PA-C    LORazepam (ATIVAN) tablet 1-4 mg, 1-4 mg, Oral, Q1H PRN, 1 mg at 06/29/23 0429 **OR** LORazepam (ATIVAN) injection 1-4 mg, 1-4 mg, Intravenous, Q1H PRN, Felipe Horton, Rondell A, MD, 1 mg at 06/29/23 1416   multivitamin with minerals tablet 1 tablet, 1 tablet, Oral, Daily, Manny Sees A, MD, 1 tablet at 06/30/23 3244   nicotine  (NICODERM CQ  - dosed in mg/24 hours) patch 21 mg, 21 mg, Transdermal, Daily, Iva Mariner, MD, 21 mg at 06/30/23 0102   ondansetron  (ZOFRAN ) tablet 4 mg, 4 mg, Oral, Q6H PRN **OR** ondansetron  (ZOFRAN ) injection 4 mg, 4 mg, Intravenous, Q6H PRN, Smith, Rondell A, MD   oxyCODONE (Oxy IR/ROXICODONE) immediate release tablet 5 mg, 5 mg, Oral, Q6H PRN, Patel, Pranav M, MD, 5 mg at 06/30/23 0841   polyethylene glycol (MIRALAX / GLYCOLAX) packet 17 g, 17 g, Oral, Daily, Patel, Pranav M, MD, 17 g at 06/30/23 7253   senna-docusate (Senokot-S) tablet 1 tablet, 1 tablet, Oral, BID, Patel, Pranav M, MD, 1 tablet at 06/30/23 6644   sertraline (ZOLOFT) tablet 50 mg, 50 mg, Oral, Daily, Khasir Woodrome B, MD, 50 mg at 06/30/23 0347   sodium chloride flush (NS) 0.9 % injection 3 mL, 3 mL, Intravenous, Q12H, Smith, Rondell A, MD, 3 mL at 06/30/23 4259   thiamine (VITAMIN B1) tablet 100 mg, 100 mg, Oral, Daily, 100 mg at 06/30/23 5638 **OR** thiamine (VITAMIN B1) injection 100 mg, 100 mg, Intravenous, Daily, Verdia Glad, John K, PA-C   traZODone  (DESYREL ) tablet 50 mg, 50 mg, Oral, QHS PRN, Leemon Ayala B, MD  Allergies: Allergies  Allergen Reactions   Fish Allergy Anaphylaxis   Shellfish Allergy Anaphylaxis   Tramadol Hives   Ketorolac  Nausea And Vomiting and Hives    Saratha Cunas, MD PGY-2 Psychiatry  This note was created using a voice recognition software as a result there may be grammatical errors inadvertently enclosed that do not reflect the nature of this encounter. Every attempt is made to correct such errors.

## 2023-06-30 NOTE — TOC CM/SW Note (Addendum)
 CSW notified by psychiatry MD that patient's IVC can be rescinded, commitment change form on patient's chart. CSW uploaded commitment change to [facility] system. Patient no longer under active IVC. Attending MD and RN notified.  UPDATE: CSW also met with patient to provide substance abuse resources, including inpatient and outpatient resources. Patient said he was calling [facility] and [facility] to check on bed availability, appreciative of information. No further TOC needs at this time.  Scot Cutter, [place] Clinical Social Worker [phone]

## 2023-06-30 NOTE — Plan of Care (Signed)
   Problem: Education: Goal: Ability to describe self-care measures that may prevent or decrease complications (Diabetes Survival Skills Education) will improve Outcome: Progressing Goal: Individualized Educational Video(s) Outcome: Progressing

## 2023-07-02 LAB — BLOOD CULTURE ID PANEL (REFLEXED) - BCID2

## 2023-07-02 NOTE — Discharge Summary (Signed)
 Physician Discharge Summary   Patient: Nathan Escobar MRN: [id number] DOB: November 05, 1985  Admit date:     06/28/2023  Discharge date: 06/30/2023  Discharge Physician: Charlean Congress  PCP: Patient, No Pcp Per  Recommendations at discharge: Follow-up with PCP as recommended. Follow-up with psychiatry as recommended. Follow-up with [facility] for hep C treatment.   Follow-up Information     PCP. Schedule an appointment as soon as possible for a visit in 1 week(s).   Why: with [facility] lab to look at Liver, kidney and electrolytes        [facility]. Schedule an appointment as soon as possible for a visit in 2 week(s).   Specialty: Behavioral Health Contact information: [address] [place] [address] [place]  [address] [phone]        [facility]. Schedule an appointment as soon as possible for a visit in 2 week(s).   Specialty: Infectious Diseases Contact information: [address] [place], [address] [place]  [address] [phone]               Discharge Diagnoses: Principal Problem:   SIRS (systemic inflammatory response syndrome) (HCC) Active Problems:   Suicidal ideations   Mood disorder (HCC)   Hypoglycemia   Alcohol abuse with intoxication (HCC)   Elevated troponin   Polycythemia   Polysubstance abuse [facility])  Hospital Course: The patient presented to the hospital with requesting detox.  Has PMH of substance abuse-meth, heroin, cocaine, alcohol, THC, opioids; PTSD, bipolar disorder, depression. Reportedly was undergoing mental health crisis barricading himself in his grandmother's house. Patient was IVC and admitted to the hospital.  Psychiatry was consulted with concern for suicidal ideation.  Psychiatry cleared the patient for discharge home as they recommended no indication for inpatient psychiatric admission.  Also may revoke the IVC.  There was  concern for SIRS although no evidence of active infection identified.  SIRS. Tachycardia. Tachypnea. Symptoms have resolved with hydration. Monitor for now.   Patient has leukocytosis at the time of admission. Procalcitonin level mildly elevated as well. Chest x-ray negative. Currently no evidence of active infection.   Abdominal pain. Patient reports that he has diffuse abdominal pain. Pain actually is occurring even on auscultation with a stethoscope as well as very light touch of his abdomen. Lipase level have been checked which is negative. Lactic acid is also improving with hydration. At present etiology is less likely organic. X-ray unremarkable.     LFT elevation. The setting of alcohol abuse. For now we will monitor.  Already improving.   Elevated troponin. Troponin level is 22.  Recheck is 3. Etiology is not clear.  Currently chest pain-free.  No respiratory symptoms as well. EKG shows LVH. Suspect demand ischemia in the setting of hemodynamic imbalance on the time of admission.  No chest pain.   Alcohol abuse. Drinks a case of beer on a daily basis. Lately also has been drinking 2 1/5 of liquor. Currently on CIWA protocol. At risk for withdrawal.  Pain control - [place]  Controlled Substance Reporting System database was reviewed. and patient was instructed, not to drive, operate heavy machinery, perform activities at heights, swimming or participation in water activities or provide baby-sitting services while on Pain, Sleep and Anxiety Medications; until their outpatient Physician has advised to do so again. Also recommended to not to take more than prescribed Pain, Sleep and Anxiety Medications.  Consultants:  Psychiatric  Procedures performed:  None  DISCHARGE MEDICATION: Allergies as of 06/30/2023       Reactions   Fish Allergy Anaphylaxis   Shellfish Allergy Anaphylaxis   Tramadol Hives   Ketorolac  Nausea And Vomiting, Hives         Medication List     STOP taking these medications    nicotine  14 mg/24hr patch Commonly known as: NICODERM CQ  - dosed in mg/24 hours Replaced by: nicotine  21 mg/24hr patch       TAKE these medications    chlordiazePOXIDE  10 MG capsule Commonly known as: LIBRIUM  Take 1 capsule (10 mg total) by mouth 2 (two) times daily for 1 day, THEN 1 capsule (10 mg total) daily at 6 (six) AM for 1 day. Start taking on: Jun 30, 2023   dicyclomine  10 MG capsule Commonly known as: BENTYL  Take 1 capsule (10 mg total) by mouth 3 (three) times daily before meals for 7 days.   folic acid  1 MG tablet Commonly known as: FOLVITE  Take 1 tablet (1 mg total) by mouth daily.   gabapentin  400 MG capsule Commonly known as: NEURONTIN  Take 1 capsule (400 mg total) by mouth 3 (three) times daily.   hydrOXYzine  25 MG tablet Commonly known as: ATARAX  Take 1 tablet (25 mg total) by mouth 3 (three) times daily as needed for anxiety.   melatonin 5 MG Tabs Take 1 tablet (5 mg total) by mouth at bedtime.   multivitamin with minerals Tabs tablet Take 1 tablet by mouth daily.   nicotine  21 mg/24hr patch Commonly known as: NICODERM CQ  - dosed in mg/24 hours Place 1 patch (21 mg total) onto the skin daily. Replaces: nicotine  14 mg/24hr patch   polyethylene glycol 17 g packet Commonly known as: MIRALAX  / GLYCOLAX  Take 17 g by mouth daily.   sertraline  50 MG tablet Commonly known as: ZOLOFT  Take 1 tablet (50 mg total) by mouth daily.   thiamine  100 MG tablet Commonly known as: Vitamin B-1 Take 1 tablet (100 mg total) by mouth daily.       Disposition: Home Diet recommendation: Regular diet  Discharge Exam: Vitals:   06/29/23 2012 06/30/23 0628 06/30/23 0810 06/30/23 1116  BP: 109/71 102/75 120/76 115/77  Pulse: (!) 52 (!) 53 60 (!) 59  Resp:  19    Temp: 97.8 F (36.6 C) 98.4 F (36.9 C) 97.7 F (36.5 C) 98.6 F (37 C)  TempSrc: Oral Oral Oral Oral  SpO2: 95% 96% 98% 98%  Weight:       Height:       General: Appear in mild distress; no visible Abnormal Neck Mass Or lumps, Conjunctiva normal Cardiovascular: S1 and S2 Present, no Murmur, Respiratory: good respiratory effort, Bilateral Air entry present and CTA, no Crackles, no wheezes Abdomen: Bowel Sound present, Non tender  Extremities: no Pedal edema Neurology: alert and oriented to time, place, and person  [facility] Weights   06/29/23 0342  Weight: 63.6 kg   Condition at discharge: stable  The results of significant diagnostics from this hospitalization (including imaging, microbiology, ancillary and laboratory) are listed below for reference.   Imaging Studies: DG Abd Portable 1V Result Date: 06/29/2023 CLINICAL DATA:  Abdomen pain EXAM: PORTABLE ABDOMEN - 1 VIEW COMPARISON:  CT 06/28/2023 FINDINGS: The bowel gas pattern is normal. No radio-opaque calculi or other significant radiographic abnormality are seen. Mild to moderate stool in the colon. IMPRESSION: Negative. Electronically Signed   By: Esmeralda Hedge M.D.   On: 06/29/2023 18:06  CT ABDOMEN PELVIS W CONTRAST Result Date: 06/28/2023 CLINICAL DATA:  Hemoptysis. EXAM: CT ABDOMEN AND PELVIS WITH CONTRAST TECHNIQUE: Multidetector CT imaging of the abdomen and pelvis was performed using the standard protocol following bolus administration of intravenous contrast. RADIATION DOSE REDUCTION: This exam was performed according to the departmental dose-optimization program which includes automated exposure control, adjustment of the mA and/or kV according to patient size and/or use of iterative reconstruction technique. CONTRAST:  75mL OMNIPAQUE  IOHEXOL  350 MG/ML SOLN COMPARISON:  None Available. FINDINGS: Lower chest: No acute abnormality. Hepatobiliary: There is diffuse fatty infiltration of the liver parenchyma. No focal liver abnormality is seen. No gallstones, gallbladder wall thickening, or biliary dilatation. Pancreas: Unremarkable. No pancreatic ductal dilatation or  surrounding inflammatory changes. Spleen: Normal in size without focal abnormality. Adrenals/Urinary Tract: Adrenal glands are unremarkable. Kidneys are normal, without renal calculi, focal lesion, or hydronephrosis. Bladder is unremarkable. Stomach/Bowel: Stomach is within normal limits. Appendix appears normal. The terminal ileum is approaching the upper limit of normal caliber. No evidence of bowel wall thickening or inflammatory changes. Vascular/Lymphatic: No significant vascular findings are present. No enlarged abdominal or pelvic lymph nodes. Reproductive: Prostate is unremarkable. Other: No abdominal wall hernia or abnormality. No abdominopelvic ascites. Musculoskeletal: No acute or significant osseous findings. IMPRESSION: Hepatic steatosis. Electronically Signed   By: Virgle Grime M.D.   On: 06/28/2023 22:09   CT Angio Chest PE W/Cm &/Or Wo Cm Result Date: 06/28/2023 CLINICAL DATA:  Hemoptysis. EXAM: CT ANGIOGRAPHY CHEST WITH CONTRAST TECHNIQUE: Multidetector CT imaging of the chest was performed using the standard protocol during bolus administration of intravenous contrast. Multiplanar CT image reconstructions and MIPs were obtained to evaluate the vascular anatomy. RADIATION DOSE REDUCTION: This exam was performed according to the departmental dose-optimization program which includes automated exposure control, adjustment of the mA and/or kV according to patient size and/or use of iterative reconstruction technique. CONTRAST:  75mL OMNIPAQUE  IOHEXOL  350 MG/ML SOLN COMPARISON:  None Available. FINDINGS: Cardiovascular: The thoracic aorta is unremarkable. Satisfactory opacification of the pulmonary arteries to the segmental level. No evidence of pulmonary embolism. Normal heart size. No pericardial effusion. Mediastinum/Nodes: No enlarged mediastinal, hilar, or axillary lymph nodes. Thyroid  gland, trachea, and esophagus demonstrate no significant findings. Lungs/Pleura: Lungs are clear. No  pleural effusion or pneumothorax. Upper Abdomen: There is diffuse fatty infiltration of the liver parenchyma. Musculoskeletal: No chest wall abnormality. No acute or significant osseous findings. Review of the MIP images confirms the above findings. IMPRESSION: 1. No evidence of pulmonary embolism or acute cardiopulmonary disease. 2. Hepatic steatosis. Electronically Signed   By: Virgle Grime M.D.   On: 06/28/2023 22:06   DG Chest Portable 1 View Result Date: 06/28/2023 CLINICAL DATA:  Hemoptysis. EXAM: PORTABLE CHEST 1 VIEW COMPARISON:  September 30, 2018 FINDINGS: The heart size and mediastinal contours are within normal limits. Both lungs are clear. The visualized skeletal structures are unremarkable. IMPRESSION: No active disease. Electronically Signed   By: Virgle Grime M.D.   On: 06/28/2023 19:37    Microbiology: Results for orders placed or performed during the hospital encounter of 06/28/23  Resp panel by RT-PCR (RSV, Flu A&B, Covid) Anterior Nasal Swab     Status: None   Collection Time: 06/28/23  8:30 PM   Specimen: Anterior Nasal Swab  Result Value Ref Range Status   SARS Coronavirus 2 by RT PCR NEGATIVE NEGATIVE Final   Influenza A by PCR NEGATIVE NEGATIVE Final   Influenza B by PCR NEGATIVE NEGATIVE Final    Comment: (  NOTE) The Xpert Xpress SARS-CoV-2/FLU/RSV plus assay is intended as an aid in the diagnosis of influenza from Nasopharyngeal swab specimens and should not be used as a sole basis for treatment. Nasal washings and aspirates are unacceptable for Xpert Xpress SARS-CoV-2/FLU/RSV testing.  Fact Sheet for Patients: BloggerCourse.com  Fact Sheet for Healthcare Providers: SeriousBroker.it  This test is not yet approved or cleared by the [place]  FDA and has been authorized for detection and/or diagnosis of SARS-CoV-2 by FDA under an Emergency Use Authorization (EUA). This EUA will remain in effect (meaning  this test can be used) for the duration of the COVID-19 declaration under Section 564(b)(1) of the Act, 21 U.S.C. section 360bbb-3(b)(1), unless the authorization is terminated or revoked.     Resp Syncytial Virus by PCR NEGATIVE NEGATIVE Final    Comment: (NOTE) Fact Sheet for Patients: BloggerCourse.com  Fact Sheet for Healthcare Providers: SeriousBroker.it  This test is not yet approved or cleared by the [place]  FDA and has been authorized for detection and/or diagnosis of SARS-CoV-2 by FDA under an Emergency Use Authorization (EUA). This EUA will remain in effect (meaning this test can be used) for the duration of the COVID-19 declaration under Section 564(b)(1) of the Act, 21 U.S.C. section 360bbb-3(b)(1), unless the authorization is terminated or revoked.  Performed at [facility], [address], [place], [place] [address]   Blood Culture (routine x 2)     Status: None (Preliminary result)   Collection Time: 06/28/23  8:50 PM   Specimen: BLOOD  Result Value Ref Range Status   Specimen Description BLOOD LEFT ANTECUBITAL  Final   Special Requests   Final    BOTTLES DRAWN AEROBIC AND ANAEROBIC Blood Culture adequate volume   Culture   Final    NO GROWTH 4 DAYS Performed at [facility], [address] [place], [place] [address]    Report Status PENDING  Incomplete  Blood Culture (routine x 2)     Status: None (Preliminary result)   Collection Time: 06/28/23  9:05 PM   Specimen: BLOOD  Result Value Ref Range Status   Specimen Description BLOOD RIGHT ANTECUBITAL  Final   Special Requests   Final    BOTTLES DRAWN AEROBIC AND ANAEROBIC Blood Culture adequate volume   Culture   Final    NO GROWTH 4 DAYS Performed at [facility], [address] [place], [place] [address]    Report Status PENDING  Incomplete   Labs: CBC: Recent Labs  Lab 06/28/23 1831 06/29/23 0454 06/29/23 0836  WBC  27.1* 10.6* 9.0  NEUTROABS 22.5*  --  5.5  HGB 17.3* 14.4 14.0  HCT 50.5 40.9 41.1  MCV 93.0 91.3 92.8  PLT 476* 287 311   Basic Metabolic Panel: Recent Labs  Lab 06/28/23 1831 06/28/23 2050 06/29/23 0454 06/30/23 0626  NA 140  --  132* 137  K 3.6  --  4.4 4.0  CL 104  --  104 104  CO2 21*  --  24 27  GLUCOSE 57*  --  73 93  BUN 12  --  16 8  CREATININE 1.22  --  1.07 0.91  CALCIUM 9.1  --  8.1* 8.9  MG  --  2.1  --  1.8   Liver Function Tests: Recent Labs  Lab 06/28/23 1831 06/29/23 0454 06/30/23 0626  AST 94* 64* 129*  ALT 106* 80* 124*  ALKPHOS 63 49 49  BILITOT 1.0 0.8 0.8  PROT  8.3* 6.1* 6.9  ALBUMIN 4.4 3.1* 3.6   CBG: Recent Labs  Lab 06/29/23 1147 06/29/23 2008 06/30/23 0006 06/30/23 0348 06/30/23 0742  GLUCAP 85 87 92 91 97    Discharge time spent: greater than 30 minutes.  Author: Charlean Congress, MD  [facility] 06/30/2023

## 2023-07-03 LAB — CULTURE, BLOOD (ROUTINE X 2)
Culture: NO GROWTH
Special Requests: ADEQUATE

## 2023-07-04 ENCOUNTER — Other Ambulatory Visit (HOSPITAL_COMMUNITY): Payer: Self-pay

## 2023-07-04 ENCOUNTER — Telehealth: Payer: Self-pay

## 2023-07-04 NOTE — Telephone Encounter (Signed)
 [facility] Patient Advocate Encounter  Insurance verification completed.    The patient is insured through [place].     Ran test claim for [id number] The current 30 day co-pay is $0.  Ran test claim for [facility] The current 30 day co-pay is $0.  Ran test claim for DOVATO The current 30 day co-pay is $0.   We will continue to follow to see if copay assistance is needed.  This test claim was processed through [facility]- copay amounts may vary at other pharmacies due to pharmacy/plan contracts, or as the patient moves through the different stages of their insurance plan.

## 2023-07-06 LAB — CULTURE, BLOOD (ROUTINE X 2): Special Requests: ADEQUATE

## 2023-07-19 ENCOUNTER — Encounter: Payer: MEDICAID | Admitting: Internal Medicine
